# Patient Record
Sex: Female | Born: 1975 | Race: White | Hispanic: No | Smoking: Never smoker
Health system: Southern US, Community
[De-identification: ages and names within clinical notes are randomized; demographics above are authoritative.]

## PROBLEM LIST (undated history)

## (undated) DIAGNOSIS — R112 Nausea with vomiting, unspecified: Secondary | ICD-10-CM

## (undated) DIAGNOSIS — D509 Iron deficiency anemia, unspecified: Secondary | ICD-10-CM

## (undated) DIAGNOSIS — A6000 Herpesviral infection of urogenital system, unspecified: Secondary | ICD-10-CM

## (undated) DIAGNOSIS — L821 Other seborrheic keratosis: Secondary | ICD-10-CM

## (undated) DIAGNOSIS — F419 Anxiety disorder, unspecified: Secondary | ICD-10-CM

## (undated) DIAGNOSIS — Z9889 Other specified postprocedural states: Secondary | ICD-10-CM

## (undated) DIAGNOSIS — Z9884 Bariatric surgery status: Secondary | ICD-10-CM

## (undated) DIAGNOSIS — E538 Deficiency of other specified B group vitamins: Secondary | ICD-10-CM

## (undated) DIAGNOSIS — M7070 Other bursitis of hip, unspecified hip: Secondary | ICD-10-CM

## (undated) DIAGNOSIS — R5383 Other fatigue: Secondary | ICD-10-CM

## (undated) HISTORY — DX: Bariatric surgery status: Z98.84

## (undated) HISTORY — DX: Other bursitis of hip, unspecified hip: M70.70

## (undated) HISTORY — DX: Herpesviral infection of urogenital system, unspecified: A60.00

## (undated) HISTORY — DX: Other seborrheic keratosis: L82.1

## (undated) HISTORY — DX: Deficiency of other specified B group vitamins: E53.8

## (undated) HISTORY — PX: WISDOM TOOTH EXTRACTION: SHX21

## (undated) HISTORY — DX: Anxiety disorder, unspecified: F41.9

## (undated) HISTORY — DX: Iron deficiency anemia, unspecified: D50.9

## (undated) HISTORY — DX: Other fatigue: R53.83

---

## 1995-10-07 HISTORY — PX: CHOLECYSTECTOMY: SHX55

## 2001-10-06 HISTORY — PX: GASTRIC BYPASS: SHX52

## 2001-10-07 ENCOUNTER — Other Ambulatory Visit: Admission: RE | Admit: 2001-10-07 | Discharge: 2001-10-07 | Payer: Self-pay | Admitting: *Deleted

## 2002-10-06 HISTORY — PX: ABDOMINOPLASTY: SUR9

## 2002-10-06 HISTORY — PX: BREAST ENHANCEMENT SURGERY: SHX7

## 2004-03-26 ENCOUNTER — Other Ambulatory Visit: Admission: RE | Admit: 2004-03-26 | Discharge: 2004-03-26 | Payer: Self-pay | Admitting: Internal Medicine

## 2004-10-06 HISTORY — PX: TUBAL LIGATION: SHX77

## 2005-03-03 ENCOUNTER — Emergency Department (HOSPITAL_COMMUNITY): Admission: EM | Admit: 2005-03-03 | Discharge: 2005-03-03 | Payer: Self-pay | Admitting: Emergency Medicine

## 2005-08-16 ENCOUNTER — Inpatient Hospital Stay (HOSPITAL_COMMUNITY): Admission: AD | Admit: 2005-08-16 | Discharge: 2005-08-17 | Payer: Self-pay | Admitting: Obstetrics

## 2005-08-26 ENCOUNTER — Encounter (INDEPENDENT_AMBULATORY_CARE_PROVIDER_SITE_OTHER): Payer: Self-pay | Admitting: Specialist

## 2005-08-26 ENCOUNTER — Inpatient Hospital Stay (HOSPITAL_COMMUNITY): Admission: AD | Admit: 2005-08-26 | Discharge: 2005-08-27 | Payer: Self-pay | Admitting: Obstetrics

## 2005-10-29 ENCOUNTER — Ambulatory Visit: Payer: Self-pay | Admitting: Hematology and Oncology

## 2006-02-10 ENCOUNTER — Other Ambulatory Visit: Admission: RE | Admit: 2006-02-10 | Discharge: 2006-02-10 | Payer: Self-pay | Admitting: Internal Medicine

## 2006-02-18 ENCOUNTER — Ambulatory Visit: Payer: Self-pay | Admitting: Hematology and Oncology

## 2006-02-20 LAB — CBC WITH DIFFERENTIAL/PLATELET
EOS%: 1.3 % (ref 0.0–7.0)
Eosinophils Absolute: 0.1 10*3/uL (ref 0.0–0.5)
HCT: 37.1 % (ref 34.8–46.6)
LYMPH%: 39.3 % (ref 14.0–48.0)
MCHC: 35.1 g/dL (ref 32.0–36.0)
MCV: 88.7 fL (ref 81.0–101.0)
MONO#: 0.2 10*3/uL (ref 0.1–0.9)
MONO%: 5.6 % (ref 0.0–13.0)
NEUT%: 52.1 % (ref 39.6–76.8)
lymph#: 1.7 10*3/uL (ref 0.9–3.3)

## 2006-02-20 LAB — VITAMIN B12: Vitamin B-12: 317 pg/mL (ref 211–911)

## 2006-04-21 ENCOUNTER — Ambulatory Visit: Payer: Self-pay | Admitting: Hematology and Oncology

## 2006-05-15 LAB — CBC WITH DIFFERENTIAL/PLATELET
Basophils Absolute: 0 10*3/uL (ref 0.0–0.1)
HCT: 39.1 % (ref 34.8–46.6)
HGB: 13.5 g/dL (ref 11.6–15.9)
MCH: 31.8 pg (ref 26.0–34.0)
MCHC: 34.5 g/dL (ref 32.0–36.0)
MCV: 92.2 fL (ref 81.0–101.0)
RBC: 4.24 10*6/uL (ref 3.70–5.32)
WBC: 5.5 10*3/uL (ref 3.9–10.0)

## 2006-05-15 LAB — IRON AND TIBC: Iron: 109 ug/dL (ref 42–145)

## 2006-06-05 ENCOUNTER — Ambulatory Visit: Payer: Self-pay | Admitting: Hematology and Oncology

## 2006-07-15 ENCOUNTER — Ambulatory Visit: Payer: Self-pay | Admitting: Hematology and Oncology

## 2006-09-08 ENCOUNTER — Ambulatory Visit: Payer: Self-pay | Admitting: Hematology and Oncology

## 2006-11-03 ENCOUNTER — Ambulatory Visit: Payer: Self-pay | Admitting: Hematology and Oncology

## 2006-11-06 LAB — BASIC METABOLIC PANEL
BUN: 7 mg/dL (ref 6–23)
CO2: 25 mEq/L (ref 19–32)
Chloride: 106 mEq/L (ref 96–112)
Creatinine, Ser: 0.64 mg/dL (ref 0.40–1.20)
Potassium: 3.6 mEq/L (ref 3.5–5.3)
Sodium: 142 mEq/L (ref 135–145)

## 2006-11-06 LAB — CBC WITH DIFFERENTIAL/PLATELET
BASO%: 0.7 % (ref 0.0–2.0)
Basophils Absolute: 0 10*3/uL (ref 0.0–0.1)
HCT: 40.2 % (ref 34.8–46.6)
HGB: 14.2 g/dL (ref 11.6–15.9)
MCV: 91.2 fL (ref 81.0–101.0)
NEUT#: 3.6 10*3/uL (ref 1.5–6.5)
Platelets: 316 10*3/uL (ref 145–400)
RDW: 11.8 % (ref 11.3–14.5)
WBC: 5.7 10*3/uL (ref 3.9–10.0)
lymph#: 1.6 10*3/uL (ref 0.9–3.3)

## 2006-11-06 LAB — IRON AND TIBC: UIBC: 250 ug/dL

## 2006-11-06 LAB — FERRITIN: Ferritin: 160 ng/mL (ref 10–291)

## 2006-11-06 LAB — VITAMIN B12: Vitamin B-12: 489 pg/mL (ref 211–911)

## 2006-12-31 ENCOUNTER — Ambulatory Visit: Payer: Self-pay | Admitting: Hematology and Oncology

## 2007-03-09 ENCOUNTER — Ambulatory Visit: Payer: Self-pay | Admitting: Hematology and Oncology

## 2007-05-04 ENCOUNTER — Ambulatory Visit: Payer: Self-pay | Admitting: Hematology and Oncology

## 2007-05-21 LAB — CBC WITH DIFFERENTIAL/PLATELET
BASO%: 0.7 % (ref 0.0–2.0)
EOS%: 0.6 % (ref 0.0–7.0)
HCT: 39.5 % (ref 34.8–46.6)
HGB: 14.1 g/dL (ref 11.6–15.9)
MCH: 32.3 pg (ref 26.0–34.0)
MONO#: 0.4 10*3/uL (ref 0.1–0.9)
NEUT%: 56.5 % (ref 39.6–76.8)
Platelets: 317 10*3/uL (ref 145–400)
RDW: 11.9 % (ref 11.3–14.5)
WBC: 6.2 10*3/uL (ref 3.9–10.0)

## 2007-05-22 LAB — FERRITIN: Ferritin: 67 ng/mL (ref 10–291)

## 2007-05-22 LAB — VITAMIN B12: Vitamin B-12: 360 pg/mL (ref 211–911)

## 2007-05-22 LAB — IRON AND TIBC: Iron: 71 ug/dL (ref 42–145)

## 2007-06-24 ENCOUNTER — Ambulatory Visit: Payer: Self-pay | Admitting: Hematology and Oncology

## 2007-08-19 ENCOUNTER — Ambulatory Visit: Payer: Self-pay | Admitting: Hematology and Oncology

## 2007-10-15 ENCOUNTER — Ambulatory Visit: Payer: Self-pay | Admitting: Hematology and Oncology

## 2007-12-08 ENCOUNTER — Ambulatory Visit: Payer: Self-pay | Admitting: Hematology and Oncology

## 2011-02-21 NOTE — Discharge Summary (Signed)
NAME:  Abigail [name], Abigail [name]              ACCOUNT NO.:  [id number]   MEDICAL RECORD NO.:  [id number]          PATIENT TYPE:  INP   LOCATION:  9130                          FACILITY:  [facility]   PHYSICIAN:  Kathreen Cosier, M.D.DATE OF BIRTH:  1976-08-11   DATE OF ADMISSION:  08/26/2005  DATE OF DISCHARGE:                                 DISCHARGE SUMMARY   Patient is a 35 year old gravida 3, para 1-0-1-1, [facility] September 08, 2005.  Was  admitted in labor.  Made rapid progress and had a normal vaginal delivery.  The patient desired sterilization and on the day of delivery underwent  postpartum tubal ligation.  She desired discharge on day one.  On admission  her hemoglobin was 9.4, postoperative 7.1.  She was asymptomatic and started  on ferrous sulfate 325 b.i.d.  The patient was discharged home on the first  postpartum day, ambulatory, on a regular diet with no complaints.  She was  on Tylenol No.3 one to two every three to four hours p.r.n. and ferrous  sulfate 325 p.o. b.i.d.   DISCHARGE DIAGNOSES:  Status post normal vaginal delivery at term and  postpartum tubal ligation.           ______________________________  Kathreen Cosier, M.D.     BAM/MEDQ  D:  08/27/2005  T:  08/27/2005  Job:  [id number]

## 2011-02-21 NOTE — Op Note (Signed)
NAME:  Abigail [name], Abigail [name]              ACCOUNT NO.:  [id number]   MEDICAL RECORD NO.:  [id number]          PATIENT TYPE:  INP   LOCATION:  9130                          FACILITY:  [facility]   PHYSICIAN:  Kathreen Cosier, M.D.DATE OF BIRTH:  Jan 24, 1976   DATE OF PROCEDURE:  08/26/2005  DATE OF DISCHARGE:                                 OPERATIVE REPORT   PREOPERATIVE DIAGNOSES:  1.  Multiparity.  2.  Previous bypass surgery.  3.  Previous abdominoplasty.  4.  Previous gallbladder surgery.   DESCRIPTION OF PROCEDURE:   PREOPERATIVE DIAGNOSIS:  Multiparity.   OPERATION/PROCEDURE:  Postpartum tubal ligation.   ANESTHESIA:  Epidural.   DESCRIPTION OF PROCEDURE:  Under general anesthesia and the patient in the  supine position, abdomen was prepped and draped. Bladder emptied with  straight catheter.  Midline subumbilical incision one inch long was made  immediately below the umbilicus, carried down to the fascia.  Fascia was  thickened, scarred then had retention sutures.  Eventually the fascia was  cut and the peritoneum and tip of the finger, the left tube was grasped in  the mid portion with the Babcock clamp and traced to the fimbria.  A 0 plain  suture was placed on the mesosalpinx below the portion of the tube within  the clamp.  This was tied.  Approximately one inch of tube was transected.  On the right side there were adhesions around the tube and eventually the  tube was snared at the __________ fimbria and traced back to insertion to  the uterus and 0 plain suture placed in the mesosalpinx below the portion of  the tube within the clamp.  This was tied, cut.  Abdomen closed.  The fascia  was closed with continuous suture with 0 Dexon.  Skin closed with  subcuticular stitch of 4-0 Monocryl.  The patient tolerated the procedure  well and taken to the recovery room in good condition.           ______________________________  Kathreen Cosier, M.D.     BAM/MEDQ  D:   08/26/2005  T:  08/26/2005  Job:  [id number]

## 2012-09-16 ENCOUNTER — Telehealth: Payer: Self-pay | Admitting: Oncology

## 2012-09-16 NOTE — Telephone Encounter (Signed)
C/D 09/16/12 for appt.09/23/12

## 2012-09-22 ENCOUNTER — Encounter: Payer: Self-pay | Admitting: Oncology

## 2012-09-22 DIAGNOSIS — D509 Iron deficiency anemia, unspecified: Secondary | ICD-10-CM | POA: Insufficient documentation

## 2012-09-22 NOTE — Patient Instructions (Addendum)
1. Issue:  Anemia. 2.  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  Also need to rule out VitB12 defiency. 3.  Recommendation:    * start oral iron:  Either NuIron 150mg  by mouth twice daily or SlowFe 325mg  by mouth twice daily.  * IV iron (Ferahem) infusion to improve iron store and anemia much quicker.

## 2012-09-23 ENCOUNTER — Encounter: Payer: Self-pay | Admitting: Oncology

## 2012-09-23 ENCOUNTER — Telehealth: Payer: Self-pay | Admitting: Oncology

## 2012-09-23 ENCOUNTER — Ambulatory Visit (HOSPITAL_BASED_OUTPATIENT_CLINIC_OR_DEPARTMENT_OTHER): Payer: BC Managed Care – PPO | Admitting: Oncology

## 2012-09-23 ENCOUNTER — Other Ambulatory Visit (HOSPITAL_BASED_OUTPATIENT_CLINIC_OR_DEPARTMENT_OTHER): Payer: BC Managed Care – PPO | Admitting: Lab

## 2012-09-23 ENCOUNTER — Ambulatory Visit (HOSPITAL_BASED_OUTPATIENT_CLINIC_OR_DEPARTMENT_OTHER): Payer: BC Managed Care – PPO

## 2012-09-23 VITALS — BP 114/77 | HR 72 | Temp 98.1°F | Resp 18 | Ht 65.0 in | Wt 172.4 lb

## 2012-09-23 DIAGNOSIS — D509 Iron deficiency anemia, unspecified: Secondary | ICD-10-CM

## 2012-09-23 DIAGNOSIS — Z9884 Bariatric surgery status: Secondary | ICD-10-CM

## 2012-09-23 LAB — COMPREHENSIVE METABOLIC PANEL (CC13)
ALT: 21 U/L (ref 0–55)
Albumin: 3.4 g/dL — ABNORMAL LOW (ref 3.5–5.0)
Alkaline Phosphatase: 101 U/L (ref 40–150)
CO2: 28 mEq/L (ref 22–29)
Calcium: 9 mg/dL (ref 8.4–10.4)
Chloride: 106 mEq/L (ref 98–107)
Glucose: 95 mg/dl (ref 70–99)
Potassium: 4.5 mEq/L (ref 3.5–5.1)
Total Bilirubin: 0.37 mg/dL (ref 0.20–1.20)

## 2012-09-23 LAB — CBC WITH DIFFERENTIAL/PLATELET
BASO%: 1.6 % (ref 0.0–2.0)
Basophils Absolute: 0.1 10*3/uL (ref 0.0–0.1)
MCHC: 31.3 g/dL — ABNORMAL LOW (ref 31.5–36.0)
MCV: 66.6 fL — ABNORMAL LOW (ref 79.5–101.0)
NEUT#: 2.3 10*3/uL (ref 1.5–6.5)
NEUT%: 43.2 % (ref 38.4–76.8)
lymph#: 2.5 10*3/uL (ref 0.9–3.3)

## 2012-09-23 LAB — IRON AND TIBC: %SAT: 4 % — ABNORMAL LOW (ref 20–55)

## 2012-09-23 LAB — FERRITIN: Ferritin: 2 ng/mL — ABNORMAL LOW (ref 10–291)

## 2012-09-23 NOTE — Progress Notes (Signed)
CHECKED IN NEW PATIENT. NO FINANCIAL ISSUES.

## 2012-09-23 NOTE — Telephone Encounter (Signed)
Gave pt appt for tomorrow IV Iron ,then labs every month, see ML on June 2014 with labs

## 2012-09-23 NOTE — Progress Notes (Signed)
[facility]  Telephone:([phone]) [phone] [phone]) [phone]     INITIAL HEMATOLOGY CONSULTATION    Referral MD:  Windy Carina, [name].   Reason for Referral:  Anemia.     HPI:  [name] is a 36 year-old woman with history of obesity, s/p gastric bypass in 2003.  She had history of iron deficiency anemia and VitB12 deficiency anemia requiring IV iron and Vit B12 injection about 6 years ago when she was pregnant with her 2nd child.  Since then she was told that she was doing OK.  She has not been taking oral iron until a few weeks ago when she was found out to have microcytic anemia again.  She was kindly referred to [facility] for evaluation.  [name] presented to the clinic by herself today.  She reported ice picca a few months ago.  She has been feeling tired the last few weeks.  However, she also had a flu.  She reported generalized fatigue, SOB, and mild DOE.  She denied all visible source of bleeding.    Patient denies fever, anorexia, weight loss, headache, visual changes, confusion, drenching night sweats, palpable lymph node swelling, mucositis, odynophagia, dysphagia, nausea vomiting, jaundice, chest pain, palpitation, productive cough, gum bleeding, epistaxis, hematemesis, hemoptysis, abdominal pain, abdominal swelling, early satiety, melena, hematochezia, hematuria, skin rash, spontaneous bleeding, joint swelling, joint pain, heat or cold intolerance, bowel bladder incontinence, back pain, focal motor weakness, paresthesia, depression.      Past Medical History  Diagnosis Date  . Iron deficiency anemia     last IV iron in 2007; with oral supplement; since  last week; Rx iron  . Seborrheic keratosis   . Unspecified deficiency anemia   :    Past Surgical History  Procedure Date  . Gastric bypass 2003  . Cholecystectomy   . Tubal ligation   :   CURRENT MEDS: Current Outpatient Prescriptions  Medication Sig Dispense Refill  .  ALPRAZolam (XANAX) 0.25 MG tablet Take 0.25 mg by mouth at bedtime as needed.      . IRON PO Take by mouth 2 (two) times daily.          No Known Allergies:  Family History  Problem Relation Age of Onset  . Diabetes Father   . Heart failure Father   . Cancer Maternal Grandfather     colon  :  History   Social History  . Marital Status: Single    Spouse Name: N/A    Number of Children: 2  . Years of Education: N/A   Occupational History  .  [employer]   Social History Main Topics  . Smoking status: Not on file  . Smokeless tobacco: Not on file  . Alcohol Use: No  . Drug Use: No  . Sexually Active:    Other Topics Concern  . Not on file   Social History Narrative  . No narrative on file  :  REVIEW OF SYSTEM:  The rest of the 14-point review of sytem was negative.   Exam: ECOG 0-1  General:  well-nourished woman, in no acute distress.  Eyes:  no scleral icterus.  ENT:  There were no oropharyngeal lesions.  Neck was without thyromegaly.  Lymphatics:  Negative cervical, supraclavicular or axillary adenopathy.  Respiratory: lungs were clear bilaterally without wheezing or crackles.  Cardiovascular:  Regular rate and rhythm, S1/S2, without murmur, rub or gallop.  There  was no pedal edema.  GI:  abdomen was soft, flat, nontender, nondistended, without organomegaly. Rectal exam in the presence of nursing staff [name] showed normal rectal tone without palpable rectal or anal mass.  There was brown stool, Guaiac hem negative.  Muscoloskeletal:  no spinal tenderness of palpation of vertebral spine.  Skin exam was without echymosis, petichae.  Neuro exam was nonfocal.  Patient was able to get on and off exam table without assistance.  Gait was normal.  Patient was alerted and oriented.  Attention was good.   Language was appropriate.  Mood was normal without depression.  Speech was not pressured.  Thought content was not tangential.     LABS:  Lab Results  Component Value Date   WBC 5.3 09/23/2012   HGB 8.8* 09/23/2012   HCT 28.2* 09/23/2012   PLT 543* 09/23/2012   GLUCOSE 95 09/23/2012   ALT 21 09/23/2012   AST 26 09/23/2012   NA 140 09/23/2012   K 4.5 09/23/2012   CL 106 09/23/2012   CREATININE 0.7 09/23/2012   BUN 9.0 09/23/2012   CO2 28 09/23/2012    Blood smear review:   I personally reviewed the patient's peripheral blood smear today.  There was anisocytosis.  There was no peripheral blast.  There was no schistocytosis, spherocytosis, rouleaux formation, tear drop cell.  There was no giant platelets or platelet clumps.  There were rare target cells; occasional pencil-shaped cells;    ASSESSMENT AND PLAN:   1.  History of obesity, s/p gastric bypass in 2003. 2.  Microcytic anemia:  Most likely due to malabsorption due to gastric bypass surgery.  Stool was heme negative.  - Work up:  Iron panel and Vit B12 pending. - Treatment:   * start oral iron:  Either NuIron 150mg  by mouth twice daily or SlowFe 325mg  by mouth twice daily.  * IV iron (Ferahem) infusion to improve iron store and anemia much quicker.  There is a slight risk of infusion reaction.  She expressed informed understanding and wished to proceed.  * Anemia is not severe enough nor was there active bleeding to warrant pRBC transfusion.  3.  Follow up:  CBC at [facility] every 2 weeks until Hgb improves.  Return visit in about 6 months.       The length of time of the face-to-face encounter was 30 minutes. More than 50% of time was spent counseling and coordination of care.     Thank you for this referral.      '

## 2012-09-24 ENCOUNTER — Ambulatory Visit (HOSPITAL_BASED_OUTPATIENT_CLINIC_OR_DEPARTMENT_OTHER): Payer: BC Managed Care – PPO

## 2012-09-24 VITALS — BP 114/77 | HR 86 | Temp 98.2°F | Resp 18

## 2012-09-24 DIAGNOSIS — D509 Iron deficiency anemia, unspecified: Secondary | ICD-10-CM

## 2012-09-24 MED ORDER — SODIUM CHLORIDE 0.9 % IV SOLN
1020.0000 mg | Freq: Once | INTRAVENOUS | Status: AC
  Administered 2012-09-24: 1020 mg via INTRAVENOUS
  Filled 2012-09-24: qty 34

## 2012-09-24 NOTE — Patient Instructions (Addendum)
Ferumoxytol injection What is this medicine? FERUMOXYTOL is an iron complex. Iron is used to make healthy red blood cells, which carry oxygen and nutrients throughout the body. This medicine is used to treat iron deficiency anemia in people with chronic kidney disease. This medicine may be used for other purposes; ask your health care provider or pharmacist if you have questions. What should I tell my health care provider before I take this medicine? They need to know if you have any of these conditions: -anemia not caused by low iron levels -high levels of iron in the blood -magnetic resonance imaging (MRI) test scheduled -an unusual or allergic reaction to iron, other medicines, foods, dyes, or preservatives -pregnant or trying to get pregnant -breast-feeding How should I use this medicine? This medicine is for infusion into a vein. It is given by a health care professional in a hospital or clinic setting. Talk to your pediatrician regarding the use of this medicine in children. Special care may be needed. Overdosage: If you think you've taken too much of this medicine contact a poison control center or emergency room at once. Overdosage: If you think you have taken too much of this medicine contact a poison control center or emergency room at once. NOTE: This medicine is only for you. Do not share this medicine with others. What if I miss a dose? It is important not to miss your dose. Call your doctor or health care professional if you are unable to keep an appointment. What may interact with this medicine? This medicine may interact with the following medications: -other iron products This list may not describe all possible interactions. Give your health care provider a list of all the medicines, herbs, non-prescription drugs, or dietary supplements you use. Also tell them if you smoke, drink alcohol, or use illegal drugs. Some items may interact with your medicine. What should I watch  for while using this medicine? Visit your doctor or healthcare professional regularly. Tell your doctor or healthcare professional if your symptoms do not start to get better or if they get worse. You may need blood work done while you are taking this medicine. You may need to follow a special diet. Talk to your doctor. Foods that contain iron include: whole grains/cereals, dried fruits, beans, or peas, leafy green vegetables, and organ meats (liver, kidney). What side effects may I notice from receiving this medicine? Side effects that you should report to your doctor or health care professional as soon as possible: -allergic reactions like skin rash, itching or hives, swelling of the face, lips, or tongue -breathing problems -changes in blood pressure -feeling faint or lightheaded, falls -fever or chills -flushing, sweating, or hot feelings -swelling of the ankles or feet Side effects that usually do not require medical attention (Report these to your doctor or health care professional if they continue or are bothersome.): -diarrhea -headache -nausea, vomiting -stomach pain This list may not describe all possible side effects. Call your doctor for medical advice about side effects. You may report side effects to FDA at 1-800-FDA-1088. Where should I keep my medicine? This drug is given in a hospital or clinic and will not be stored at home. NOTE: This sheet is a summary. It may not cover all possible information. If you have questions about this medicine, talk to your doctor, pharmacist, or health care provider.  2012, Elsevier/Gold Standard. (06/14/2008 9:48:25 PM) 

## 2012-10-25 ENCOUNTER — Other Ambulatory Visit (HOSPITAL_BASED_OUTPATIENT_CLINIC_OR_DEPARTMENT_OTHER): Payer: BC Managed Care – PPO | Admitting: Lab

## 2012-10-25 DIAGNOSIS — D509 Iron deficiency anemia, unspecified: Secondary | ICD-10-CM

## 2012-10-25 LAB — CBC WITH DIFFERENTIAL/PLATELET
Basophils Absolute: 0.1 10*3/uL (ref 0.0–0.1)
Eosinophils Absolute: 0.1 10*3/uL (ref 0.0–0.5)
MONO#: 0.5 10*3/uL (ref 0.1–0.9)
NEUT#: 2.5 10*3/uL (ref 1.5–6.5)
Platelets: 269 10*3/uL (ref 145–400)
RBC: 4.75 10*6/uL (ref 3.70–5.45)
WBC: 5.2 10*3/uL (ref 3.9–10.3)
lymph#: 1.9 10*3/uL (ref 0.9–3.3)

## 2012-11-22 ENCOUNTER — Other Ambulatory Visit: Payer: BC Managed Care – PPO

## 2012-12-20 ENCOUNTER — Other Ambulatory Visit: Payer: BC Managed Care – PPO

## 2012-12-22 DIAGNOSIS — A6000 Herpesviral infection of urogenital system, unspecified: Secondary | ICD-10-CM | POA: Insufficient documentation

## 2013-01-24 ENCOUNTER — Encounter: Payer: Self-pay | Admitting: Oncology

## 2013-01-24 ENCOUNTER — Other Ambulatory Visit (HOSPITAL_BASED_OUTPATIENT_CLINIC_OR_DEPARTMENT_OTHER): Payer: BC Managed Care – PPO | Admitting: Lab

## 2013-01-24 DIAGNOSIS — D509 Iron deficiency anemia, unspecified: Secondary | ICD-10-CM

## 2013-01-24 LAB — FERRITIN: Ferritin: 9 ng/mL — ABNORMAL LOW (ref 10–291)

## 2013-01-24 LAB — CBC WITH DIFFERENTIAL/PLATELET
Basophils Absolute: 0.1 10*3/uL (ref 0.0–0.1)
Eosinophils Absolute: 0 10*3/uL (ref 0.0–0.5)
HGB: 13.6 g/dL (ref 11.6–15.9)
MCV: 90 fL (ref 79.5–101.0)
MONO#: 0.3 10*3/uL (ref 0.1–0.9)
NEUT#: 3 10*3/uL (ref 1.5–6.5)
RDW: 13.4 % (ref 11.2–14.5)
lymph#: 1.8 10*3/uL (ref 0.9–3.3)

## 2013-01-24 LAB — IRON AND TIBC
%SAT: 36 % (ref 20–55)
Iron: 146 ug/dL — ABNORMAL HIGH (ref 42–145)
TIBC: 407 ug/dL (ref 250–470)
UIBC: 261 ug/dL (ref 125–400)

## 2013-01-25 ENCOUNTER — Other Ambulatory Visit: Payer: Self-pay | Admitting: Oncology

## 2013-01-25 ENCOUNTER — Encounter: Payer: Self-pay | Admitting: Oncology

## 2013-02-17 ENCOUNTER — Telehealth: Payer: Self-pay | Admitting: Oncology

## 2013-02-17 NOTE — Telephone Encounter (Signed)
, °

## 2013-02-18 ENCOUNTER — Encounter: Payer: Self-pay | Admitting: Oncology

## 2013-02-18 ENCOUNTER — Other Ambulatory Visit (HOSPITAL_BASED_OUTPATIENT_CLINIC_OR_DEPARTMENT_OTHER): Payer: BC Managed Care – PPO | Admitting: Lab

## 2013-02-18 DIAGNOSIS — D509 Iron deficiency anemia, unspecified: Secondary | ICD-10-CM

## 2013-02-18 LAB — CBC WITH DIFFERENTIAL/PLATELET
BASO%: 1.1 % (ref 0.0–2.0)
EOS%: 1.7 % (ref 0.0–7.0)
MCH: 31.7 pg (ref 25.1–34.0)
MCHC: 33.9 g/dL (ref 31.5–36.0)
MONO%: 7.4 % (ref 0.0–14.0)
RDW: 14.1 % (ref 11.2–14.5)
lymph#: 2.1 10*3/uL (ref 0.9–3.3)

## 2013-02-21 ENCOUNTER — Other Ambulatory Visit: Payer: BC Managed Care – PPO

## 2013-03-24 ENCOUNTER — Other Ambulatory Visit: Payer: BC Managed Care – PPO | Admitting: Lab

## 2013-03-24 ENCOUNTER — Ambulatory Visit: Payer: BC Managed Care – PPO | Admitting: Oncology

## 2013-08-01 DIAGNOSIS — F411 Generalized anxiety disorder: Secondary | ICD-10-CM | POA: Insufficient documentation

## 2013-08-05 ENCOUNTER — Telehealth: Payer: Self-pay | Admitting: Hematology and Oncology

## 2013-08-05 NOTE — Telephone Encounter (Signed)
pt called and made FU appt to have labs and see NG since Dr Gaylyn Rong is no longer here shh

## 2013-08-09 ENCOUNTER — Telehealth: Payer: Self-pay | Admitting: Hematology and Oncology

## 2013-08-09 ENCOUNTER — Telehealth: Payer: Self-pay | Admitting: *Deleted

## 2013-08-09 NOTE — Telephone Encounter (Signed)
tx pt to Dr Maxine Glenn nurse Cameo bc she was asking for appt earlier than 08/15/13  shh

## 2013-08-09 NOTE — Telephone Encounter (Signed)
Pt left VM states her Ferritin done by PCP was 4.6.  Pt c/o feeling "really exhausted" and wants to know if she can be seen sooner than Monday 11/10 as scheduled?

## 2013-08-10 NOTE — Telephone Encounter (Signed)
I have a slot Friday 1145 am

## 2013-08-11 ENCOUNTER — Encounter: Payer: Self-pay | Admitting: Hematology and Oncology

## 2013-08-11 ENCOUNTER — Telehealth: Payer: Self-pay | Admitting: Hematology and Oncology

## 2013-08-11 ENCOUNTER — Other Ambulatory Visit: Payer: Self-pay | Admitting: Hematology and Oncology

## 2013-08-11 DIAGNOSIS — D509 Iron deficiency anemia, unspecified: Secondary | ICD-10-CM

## 2013-08-11 DIAGNOSIS — R5383 Other fatigue: Secondary | ICD-10-CM

## 2013-08-11 HISTORY — DX: Other fatigue: R53.83

## 2013-08-11 NOTE — Telephone Encounter (Signed)
Notified pt of lab and Dr. Bertis Ruddy tomorrow. She verbalized understanding.

## 2013-08-11 NOTE — Telephone Encounter (Signed)
Left VM informing pt of appt available tomorrow at 11:45 am.  Please call back to confirm, otherwise we will keep her appt on Monday as scheduled.  Pt left VM confirming she will come to see Dr. Bertis Ruddy at 11:45 am tomorrow.  She asks if she needs to come early for labs?

## 2013-08-11 NOTE — Telephone Encounter (Signed)
Added lb/fu for tomorrow 11/7 @ 11:15a. Confirmed d/t w/desk nurse. Per desk nurse pt aware.

## 2013-08-11 NOTE — Telephone Encounter (Signed)
I just put in orders

## 2013-08-12 ENCOUNTER — Ambulatory Visit (HOSPITAL_BASED_OUTPATIENT_CLINIC_OR_DEPARTMENT_OTHER): Payer: BC Managed Care – PPO | Admitting: Hematology and Oncology

## 2013-08-12 ENCOUNTER — Ambulatory Visit (HOSPITAL_BASED_OUTPATIENT_CLINIC_OR_DEPARTMENT_OTHER): Payer: BC Managed Care – PPO

## 2013-08-12 ENCOUNTER — Encounter: Payer: Self-pay | Admitting: Hematology and Oncology

## 2013-08-12 ENCOUNTER — Other Ambulatory Visit (HOSPITAL_BASED_OUTPATIENT_CLINIC_OR_DEPARTMENT_OTHER): Payer: BC Managed Care – PPO

## 2013-08-12 VITALS — BP 111/80 | HR 69 | Temp 97.0°F | Resp 18 | Ht 65.0 in | Wt 177.7 lb

## 2013-08-12 DIAGNOSIS — R5383 Other fatigue: Secondary | ICD-10-CM

## 2013-08-12 DIAGNOSIS — D509 Iron deficiency anemia, unspecified: Secondary | ICD-10-CM

## 2013-08-12 DIAGNOSIS — R5381 Other malaise: Secondary | ICD-10-CM

## 2013-08-12 DIAGNOSIS — E538 Deficiency of other specified B group vitamins: Secondary | ICD-10-CM

## 2013-08-12 DIAGNOSIS — Z9884 Bariatric surgery status: Secondary | ICD-10-CM

## 2013-08-12 HISTORY — DX: Bariatric surgery status: Z98.84

## 2013-08-12 HISTORY — DX: Deficiency of other specified B group vitamins: E53.8

## 2013-08-12 LAB — CBC & DIFF AND RETIC
Basophils Absolute: 0.1 10*3/uL (ref 0.0–0.1)
EOS%: 1.3 % (ref 0.0–7.0)
HGB: 12.8 g/dL (ref 11.6–15.9)
MCH: 29.8 pg (ref 25.1–34.0)
NEUT#: 2.6 10*3/uL (ref 1.5–6.5)
RBC: 4.29 10*6/uL (ref 3.70–5.45)
RDW: 12.3 % (ref 11.2–14.5)
Retic %: 1 % (ref 0.70–2.10)
Retic Ct Abs: 42.9 10*3/uL (ref 33.70–90.70)
lymph#: 2.3 10*3/uL (ref 0.9–3.3)
nRBC: 0 % (ref 0–0)

## 2013-08-12 LAB — T4, FREE: Free T4: 1.1 ng/dL (ref 0.80–1.80)

## 2013-08-12 LAB — FERRITIN CHCC: Ferritin: 6 ng/ml — ABNORMAL LOW (ref 9–269)

## 2013-08-12 LAB — IRON AND TIBC CHCC: TIBC: 419 ug/dL (ref 236–444)

## 2013-08-12 MED ORDER — SODIUM CHLORIDE 0.9 % IV SOLN
1020.0000 mg | Freq: Once | INTRAVENOUS | Status: AC
  Administered 2013-08-12: 1020 mg via INTRAVENOUS
  Filled 2013-08-12: qty 34

## 2013-08-12 MED ORDER — SODIUM CHLORIDE 0.9 % IV SOLN
Freq: Once | INTRAVENOUS | Status: AC
  Administered 2013-08-12: 13:00:00 via INTRAVENOUS

## 2013-08-12 NOTE — Progress Notes (Signed)
1320-Pt with no complaints at end of Feraheme.

## 2013-08-12 NOTE — Progress Notes (Signed)
[facility] OFFICE PROGRESS NOTE  [name], Abigail Bud, [name] DIAGNOSIS:  Severe iron deficiency anemia, on background history of gastric bypass and severe menorrhagia  SUMMARY OF HEMATOLOGIC HISTORY: This is a pleasant 37 year old lady with background history of morbid obesity status post gastric bypass in 2003. She was found to have history of B12 and iron deficiency anemia and had  received intravenous iron infusion in the past. INTERVAL HISTORY: Abigail [name] 37 y.o. female returns for further followup. She complained of new pica. She also complained of feeling fatigued. The patient endorsed history of severe menorrhagia with irregular periods, bleeding on average 5-9 days with a cycle of every 30 days. The patient denies any recent signs or symptoms of bleeding such as spontaneous epistaxis, hematuria or hematochezia.  I have reviewed the past medical history, past surgical history, social history and family history with the patient and they are unchanged from previous note.  ALLERGIES:  has No Known Allergies.  MEDICATIONS:  Current Outpatient Prescriptions  Medication Sig Dispense Refill  . ALPRAZolam (XANAX) 0.25 MG tablet Take 0.25 mg by mouth at bedtime as needed.      . IRON PO Take by mouth 2 (two) times daily.       No current facility-administered medications for this visit.   Facility-Administered Medications Ordered in Other Visits  Medication Dose Route Frequency Provider Last Rate Last Dose  . ferumoxytol (FERAHEME) 1,020 mg in sodium chloride 0.9 % 100 mL IVPB  1,020 mg Intravenous Once Artis Delay, MD   1,020 mg at 08/12/13 1256     REVIEW OF SYSTEMS:   Constitutional: Denies fevers, chills or night sweats Neurological:Denies numbness, tingling or new weaknesses Behavioral/Psych: Mood is stable, no new changes  All other systems were reviewed with the patient and are negative.  PHYSICAL EXAMINATION: ECOG PERFORMANCE STATUS: 1 - Symptomatic but completely  ambulatory  Filed Vitals:   08/12/13 1155  BP: 111/80  Pulse: 69  Temp: 97 F (36.1 C)  Resp: 18   Filed Weights   08/12/13 1155  Weight: 177 lb 11.2 oz (80.604 kg)    GENERAL:alert, no distress and comfortable. The patient is moderately obese SKIN: skin color, texture, turgor are normal, no rashes or significant lesions EYES: normal, Conjunctiva are pink and non-injected, sclera clear OROPHARYNX:no exudate, no erythema and lips, buccal mucosa, and tongue normal  NECK: supple, thyroid normal size, non-tender, without nodularity LYMPH:  no palpable lymphadenopathy in the cervical, axillary or inguinal LUNGS: clear to auscultation and percussion with normal breathing effort HEART: regular rate & rhythm and no murmurs and no lower extremity edema ABDOMEN:abdomen soft, non-tender and normal bowel sounds Musculoskeletal:no cyanosis of digits and no clubbing  NEURO: alert & oriented x 3 with fluent speech, no focal motor/sensory deficits  LABORATORY DATA:  I have reviewed the data as listed Results for orders placed in visit on 08/12/13 (from the past 48 hour(s))  CBC & DIFF AND RETIC     Status: None   Collection Time    08/12/13 11:31 AM      Result Value Range   WBC 5.5  3.9 - 10.3 10e3/uL   NEUT# 2.6  1.5 - 6.5 10e3/uL   HGB 12.8  11.6 - 15.9 g/dL   HCT 16.1  09.6 - 04.5 %   Platelets 321  145 - 400 10e3/uL   MCV 89.7  79.5 - 101.0 fL   MCH 29.8  25.1 - 34.0 pg   MCHC 33.2  31.5 -  36.0 g/dL   RBC 2.13  0.86 - 5.78 10e6/uL   RDW 12.3  11.2 - 14.5 %   lymph# 2.3  0.9 - 3.3 10e3/uL   MONO# 0.5  0.1 - 0.9 10e3/uL   Eosinophils Absolute 0.1  0.0 - 0.5 10e3/uL   Basophils Absolute 0.1  0.0 - 0.1 10e3/uL   NEUT% 47.6  38.4 - 76.8 %   LYMPH% 41.2  14.0 - 49.7 %   MONO% 8.8  0.0 - 14.0 %   EOS% 1.3  0.0 - 7.0 %   BASO% 1.1  0.0 - 2.0 %   nRBC 0  0 - 0 %   Retic % 1.00  0.70 - 2.10 %   Retic Ct Abs 42.90  33.70 - 90.70 10e3/uL   Immature Retic Fract 6.10  1.60 - 10.00 %   IRON AND TIBC [facility]     Status: Abnormal   Collection Time    08/12/13 11:31 AM      Result Value Range   Iron 67  41 - 142 ug/dL   TIBC 469  629 - 528 ug/dL   UIBC 413  244 - 010 ug/dL   %SAT 16 (*) 21 - 57 %  FERRITIN [facility]     Status: Abnormal   Collection Time    08/12/13 11:31 AM      Result Value Range   Ferritin 6 (*) 9 - 269 ng/ml  TSH [facility]     Status: None   Collection Time    08/12/13 11:31 AM      Result Value Range   TSH 1.017  0.308 - 3.960 m(IU)/L    ASSESSMENT:  Recurrent iron deficiency anemia  PLAN:  #1 recurrent iron deficiency anemia This is due to the combination of bowel obstruction from gastric bypass and menorrhagia. She never had EGD or colonoscopy done before. Her routine Pap smears have been negative. I recommend repeat intravenous iron infusion and we check another ferritin. If her ferritin rapidly drops, we might have to do GI evaluation. We discussed some of the risks, benefits, and alternatives of intravenous iron infusions. The patient is symptomatic from low iron and the iron level is critically low. She tolerated oral iron supplement poorly and desires to achieved higher levels of iron faster for adequate hematopoesis. Some of the side-effects to be expected including risks of infusion reactions, phlebitis, headaches, nausea and fatigue.  The patient is willing to proceed  #2 history of B12 deficiency I recommend high-dose oral supplement and we'll recheck a B12 level in the next visit. If the B12 is low, I recommend B12 injection #3 history of gastric bypass The patient is at risk of bowel obstruction a father minerals. I recommend high-dose vitamin D supplementation.  All questions were answered. The patient knows to call the clinic with any problems, questions or concerns. No barriers to learning was detected.  I spent 25 minutes counseling the patient face to face. The total time spent in the appointment was 40 minutes and more than 50% was on  counseling.     Tyronza Happe, MD 08/12/2013 1:07 PM

## 2013-08-12 NOTE — Patient Instructions (Signed)

## 2013-08-15 ENCOUNTER — Other Ambulatory Visit: Payer: BC Managed Care – PPO

## 2013-08-15 ENCOUNTER — Telehealth: Payer: Self-pay | Admitting: Hematology and Oncology

## 2013-08-15 ENCOUNTER — Ambulatory Visit: Payer: BC Managed Care – PPO | Admitting: Hematology and Oncology

## 2013-08-15 NOTE — Telephone Encounter (Signed)
s.w. pt and advised on 11.8.14 appt...sed added tx.

## 2013-09-12 ENCOUNTER — Other Ambulatory Visit (HOSPITAL_BASED_OUTPATIENT_CLINIC_OR_DEPARTMENT_OTHER): Payer: BC Managed Care – PPO | Admitting: Lab

## 2013-09-12 ENCOUNTER — Telehealth: Payer: Self-pay | Admitting: Hematology and Oncology

## 2013-09-12 ENCOUNTER — Ambulatory Visit: Payer: BC Managed Care – PPO

## 2013-09-12 ENCOUNTER — Encounter: Payer: Self-pay | Admitting: Hematology and Oncology

## 2013-09-12 ENCOUNTER — Ambulatory Visit (HOSPITAL_BASED_OUTPATIENT_CLINIC_OR_DEPARTMENT_OTHER): Payer: BC Managed Care – PPO | Admitting: Hematology and Oncology

## 2013-09-12 VITALS — BP 108/65 | HR 79 | Temp 98.0°F | Resp 18 | Ht 65.0 in | Wt 179.6 lb

## 2013-09-12 DIAGNOSIS — E538 Deficiency of other specified B group vitamins: Secondary | ICD-10-CM

## 2013-09-12 DIAGNOSIS — N920 Excessive and frequent menstruation with regular cycle: Secondary | ICD-10-CM

## 2013-09-12 DIAGNOSIS — D509 Iron deficiency anemia, unspecified: Secondary | ICD-10-CM

## 2013-09-12 DIAGNOSIS — Z9884 Bariatric surgery status: Secondary | ICD-10-CM

## 2013-09-12 LAB — CBC & DIFF AND RETIC
BASO%: 1.2 % (ref 0.0–2.0)
EOS%: 1.5 % (ref 0.0–7.0)
Eosinophils Absolute: 0.1 10*3/uL (ref 0.0–0.5)
LYMPH%: 27.7 % (ref 14.0–49.7)
MCHC: 33.7 g/dL (ref 31.5–36.0)
MCV: 91.6 fL (ref 79.5–101.0)
MONO#: 0.5 10*3/uL (ref 0.1–0.9)
MONO%: 7.6 % (ref 0.0–14.0)
NEUT#: 3.7 10*3/uL (ref 1.5–6.5)
NEUT%: 62 % (ref 38.4–76.8)
RBC: 4.54 10*6/uL (ref 3.70–5.45)
RDW: 13.7 % (ref 11.2–14.5)
Retic %: 1.88 % (ref 0.70–2.10)
WBC: 6 10*3/uL (ref 3.9–10.3)
nRBC: 0 % (ref 0–0)

## 2013-09-12 LAB — IRON AND TIBC CHCC
%SAT: 55 % (ref 21–57)
Iron: 155 ug/dL — ABNORMAL HIGH (ref 41–142)
TIBC: 283 ug/dL (ref 236–444)

## 2013-09-12 LAB — VITAMIN B12: Vitamin B-12: 310 pg/mL (ref 211–911)

## 2013-09-12 LAB — FERRITIN CHCC: Ferritin: 144 ng/mL (ref 9–269)

## 2013-09-12 NOTE — Progress Notes (Signed)
[name], [name], [name] DIAGNOSIS:  Severe iron deficiency anemia due to severe menorrhagia, on background history of gastric bypass  SUMMARY OF HEMATOLOGIC HISTORY: This is a pleasant 37 year old lady with background history of morbid obesity status post gastric bypass in 2003. She was found to have history of B12 and iron deficiency anemia and had received intravenous iron infusion in the past. In November 2014, she received one more dose of intravenous iron infusion INTERVAL HISTORY: Abigail [name] 36 y.o. female returns for further followup. She still had persistent menorrhagia. She has some mild fatigue. She has an appointment to see a gynecologist for further evaluation and treatment for menorrhagia. The patient denies any recent signs or symptoms of bleeding such as spontaneous epistaxis, hematuria or hematochezia.  I have reviewed the past medical history, past surgical history, social history and family history with the patient and they are unchanged from previous note.  ALLERGIES:  has No Known Allergies.  MEDICATIONS:  Current Outpatient Prescriptions  Medication Sig Dispense Refill  . ALPRAZolam (XANAX) 0.25 MG tablet Take 0.25 mg by mouth at bedtime as needed.      . IRON PO Take by mouth 2 (two) times daily.       No current facility-administered medications for this visit.     REVIEW OF SYSTEMS:   Constitutional: Denies fevers, chills or night sweats Eyes: Denies blurriness of vision Ears, nose, mouth, throat, and face: Denies mucositis or sore throat Respiratory: Denies cough, dyspnea or wheezes Cardiovascular: Denies palpitation, chest discomfort or lower extremity swelling Gastrointestinal:  Denies nausea, heartburn or change in bowel habits Skin: Denies abnormal skin rashes Lymphatics: Denies new lymphadenopathy or easy bruising Neurological:Denies numbness, tingling or new weaknesses Behavioral/Psych: Mood is  stable, no new changes  All other systems were reviewed with the patient and are negative.  PHYSICAL EXAMINATION: ECOG PERFORMANCE STATUS: 0 - Asymptomatic  Filed Vitals:   09/12/13 0930  BP: 108/65  Pulse: 79  Temp: 98 F (36.7 C)  Resp: 18   Filed Weights   09/12/13 0930  Weight: 179 lb 9.6 oz (81.466 kg)    GENERAL:alert, no distress and comfortable NEURO: alert & oriented x 3 with fluent speech, no focal motor/sensory deficits  LABORATORY DATA:  I have reviewed the data as listed Results for orders placed in visit on 09/12/13 (from the past 48 hour(s))  CBC & DIFF AND RETIC     Status: None   Collection Time    09/12/13  9:12 AM      Result Value Range   WBC 6.0  3.9 - 10.3 10e3/uL   NEUT# 3.7  1.5 - 6.5 10e3/uL   HGB 14.0  11.6 - 15.9 g/dL   HCT 82.9  56.2 - 13.0 %   Platelets 282  145 - 400 10e3/uL   MCV 91.6  79.5 - 101.0 fL   MCH 30.8  25.1 - 34.0 pg   MCHC 33.7  31.5 - 36.0 g/dL   RBC 8.65  7.84 - 6.96 10e6/uL   RDW 13.7  11.2 - 14.5 %   lymph# 1.7  0.9 - 3.3 10e3/uL   MONO# 0.5  0.1 - 0.9 10e3/uL   Eosinophils Absolute 0.1  0.0 - 0.5 10e3/uL   Basophils Absolute 0.1  0.0 - 0.1 10e3/uL   NEUT% 62.0  38.4 - 76.8 %   LYMPH% 27.7  14.0 - 49.7 %   MONO% 7.6  0.0 - 14.0 %  EOS% 1.5  0.0 - 7.0 %   BASO% 1.2  0.0 - 2.0 %   nRBC 0  0 - 0 %   Retic % 1.88  0.70 - 2.10 %   Retic Ct Abs 85.35  33.70 - 90.70 10e3/uL   Immature Retic Fract 6.10  1.60 - 10.00 %  FERRITIN [facility]     Status: None   Collection Time    09/12/13  9:12 AM      Result Value Range   Ferritin 144  9 - 269 ng/ml  IRON AND TIBC [facility]     Status: Abnormal   Collection Time    09/12/13  9:12 AM      Result Value Range   Iron 155 (*) 41 - 142 ug/dL   TIBC 161  096 - 045 ug/dL   UIBC 409  811 - 914 ug/dL   %SAT 55  21 - 57 %    Lab Results  Component Value Date   WBC 6.0 09/12/2013   HGB 14.0 09/12/2013   HCT 41.6 09/12/2013   MCV 91.6 09/12/2013   PLT 282 09/12/2013   ASSESSMENT &  PLAN:  #1 history of iron deficiency anemia Her repeat CBC showed improvement of her hemoglobin and her ferritin is now over 100. I recommend she keeps the appointment to see the gynecologist for treatment of menorrhagia. I plan to see her back in 4 months with repeat blood work #2 history of B12 deficiency I recommend she takes oral B12 supplements. B12 level from today is pending. #3 history of gastric bypass I recommend she take vitamin D supplement.  All questions were answered. The patient knows to call the clinic with any problems, questions or concerns. No barriers to learning was detected.  I spent 15 minutes counseling the patient face to face. The total time spent in the appointment was 20 minutes and more than 50% was on counseling.     Arlayne Liggins, MD 09/12/2013 10:38 AM

## 2013-09-12 NOTE — Telephone Encounter (Signed)
gave pt appt for lab and MD on April 2015 °

## 2013-09-29 ENCOUNTER — Ambulatory Visit (INDEPENDENT_AMBULATORY_CARE_PROVIDER_SITE_OTHER): Payer: BC Managed Care – PPO | Admitting: Family Medicine

## 2013-09-29 ENCOUNTER — Ambulatory Visit: Payer: BC Managed Care – PPO

## 2013-09-29 VITALS — BP 138/89 | HR 73 | Temp 98.2°F | Resp 18 | Ht 65.0 in | Wt 176.2 lb

## 2013-09-29 DIAGNOSIS — R109 Unspecified abdominal pain: Secondary | ICD-10-CM

## 2013-09-29 DIAGNOSIS — R112 Nausea with vomiting, unspecified: Secondary | ICD-10-CM

## 2013-09-29 LAB — POCT CBC
Granulocyte percent: 85.6 %G — AB (ref 37–80)
HCT, POC: 47.9 % (ref 37.7–47.9)
Hemoglobin: 15.4 g/dL (ref 12.2–16.2)
Lymph, poc: 1 (ref 0.6–3.4)
MCH, POC: 31.5 pg — AB (ref 27–31.2)
MCHC: 32.2 g/dL (ref 31.8–35.4)
MCV: 97.9 fL — AB (ref 80–97)
MID (cbc): 0.4 (ref 0–0.9)
MPV: 9 fL (ref 0–99.8)
POC Granulocyte: 8.4 — AB (ref 2–6.9)
POC LYMPH PERCENT: 10.4 %L (ref 10–50)
POC MID %: 4 % (ref 0–12)
Platelet Count, POC: 346 10*3/uL (ref 142–424)
RBC: 4.89 M/uL (ref 4.04–5.48)
RDW, POC: 15.6 %
WBC: 9.8 10*3/uL (ref 4.6–10.2)

## 2013-09-29 LAB — POCT URINALYSIS DIPSTICK
Blood, UA: NEGATIVE
Glucose, UA: NEGATIVE
Ketones, UA: >=160
Leukocytes, UA: NEGATIVE
Nitrite, UA: NEGATIVE
Protein, UA: 30
Spec Grav, UA: >=1.03
Urobilinogen, UA: 0.2
pH, UA: 5.5

## 2013-09-29 LAB — POCT UA - MICROSCOPIC ONLY
Casts, Ur, LPF, POC: NEGATIVE
Crystals, Ur, HPF, POC: NEGATIVE
Yeast, UA: NEGATIVE

## 2013-09-29 MED ORDER — ONDANSETRON 4 MG PO TBDP
4.0000 mg | ORAL_TABLET | Freq: Once | ORAL | Status: AC
  Administered 2013-09-29: 4 mg via ORAL

## 2013-09-29 MED ORDER — ONDANSETRON 4 MG PO TBDP: 4.0000 mg | ORAL_TABLET | Freq: Three times a day (TID) | ORAL | Status: DC | PRN

## 2013-09-29 NOTE — Patient Instructions (Signed)
Constipation, Adult Constipation is when a person has fewer than 3 bowel movements a week; has difficulty having a bowel movement; or has stools that are dry, hard, or larger than normal. As people grow older, constipation is more common. If you try to fix constipation with medicines that make you have a bowel movement (laxatives), the problem may get worse. Long-term laxative use may cause the muscles of the colon to become weak. A low-fiber diet, not taking in enough fluids, and taking certain medicines may make constipation worse. CAUSES   Certain medicines, such as antidepressants, pain medicine, iron supplements, antacids, and water pills.   Certain diseases, such as diabetes, irritable bowel syndrome (IBS), thyroid disease, or depression.   Not drinking enough water.   Not eating enough fiber-rich foods.   Stress or travel.  Lack of physical activity or exercise.  Not going to the restroom when there is the urge to have a bowel movement.  Ignoring the urge to have a bowel movement.  Using laxatives too much. SYMPTOMS   Having fewer than 3 bowel movements a week.   Straining to have a bowel movement.   Having hard, dry, or larger than normal stools.   Feeling full or bloated.   Pain in the lower abdomen.  Not feeling relief after having a bowel movement. DIAGNOSIS  Your caregiver will take a medical history and perform a physical exam. Further testing may be done for severe constipation. Some tests may include:   A barium enema X-ray to examine your rectum, colon, and sometimes, your small intestine.  A sigmoidoscopy to examine your lower colon.  A colonoscopy to examine your entire colon. TREATMENT  Treatment will depend on the severity of your constipation and what is causing it. Some dietary treatments include drinking more fluids and eating more fiber-rich foods. Lifestyle treatments may include regular exercise. If these diet and lifestyle recommendations  do not help, your caregiver may recommend taking over-the-counter laxative medicines to help you have bowel movements. Prescription medicines may be prescribed if over-the-counter medicines do not work.  HOME CARE INSTRUCTIONS   Increase dietary fiber in your diet, such as fruits, vegetables, whole grains, and beans. Limit high-fat and processed sugars in your diet, such as [place] fries, hamburgers, cookies, candies, and soda.   A fiber supplement may be added to your diet if you cannot get enough fiber from foods.   Drink enough fluids to keep your urine clear or pale yellow.   Exercise regularly or as directed by your caregiver.   Go to the restroom when you have the urge to go. Do not hold it.  Only take medicines as directed by your caregiver. Do not take other medicines for constipation without talking to your caregiver first. SEEK IMMEDIATE MEDICAL CARE IF:   You have bright red blood in your stool.   Your constipation lasts for more than 4 days or gets worse.   You have abdominal or rectal pain.   You have thin, pencil-like stools.  You have unexplained weight loss. MAKE SURE YOU:   Understand these instructions.  Will watch your condition.  Will get help right away if you are not doing well or get worse. Document Released: 06/20/2004 Document Revised: 12/15/2011 Document Reviewed: 08/26/2011 [facility] Patient Information 2014 [place], [place]. Abdominal Pain Many things can cause belly (abdominal) pain. Most times, the belly pain is not dangerous. The amount of belly pain does not tell how serious the problem may be. Many cases of belly pain  can be watched and treated at home. HOME CARE   Do not take medicines that help you go poop (laxatives) unless told to by your doctor.  Only take medicine as told by your doctor.  Eat or drink as told by your doctor. Your doctor will tell you if you should be on a special diet. GET HELP RIGHT AWAY IF:   The pain does not  go away.  You have a fever.  You keep throwing up (vomiting).  The pain changes and is only in the right or left part of the belly.  You have bloody or tarry looking poop. MAKE SURE YOU:   Understand these instructions.  Will watch your condition.  Will get help right away if you are not doing well or get worse. Document Released: 03/10/2008 Document Revised: 12/15/2011 Document Reviewed: 10/08/2009 [facility] Patient Information 2014 [place], [place].

## 2013-09-29 NOTE — Progress Notes (Signed)
Chief Complaint:  Chief Complaint  Patient presents with  . abdominal pain    vomitting and diarrhea x 3 days, had iron infusion a few weeks ago  being monitored for GI bleed or periods for cause.    HPI: Abigail [name] is a 37 y.o. female who is here for Pt presents with abdominal pain with diarrhea and emesis. This started 3 days ago. Last weekend she had constipation. She took OTC laxative which caused her to have nonbloody  diarrhea. At present time she is vomiting everything she takes in. + chills, no fevers. Last BM was yesterday. Has never had this befores. Has had tummy tuck, cholecystectomy, has had gastric bypass in 2003. No prior hsitory of SBO. She has been passing gas. She feels bloated and has back pain. She has not really eaten anything since yesterday. She has had nausea and vomitus, feels like things get stuck in her midchest. Had iron infusion 1 week ago for low iron for possible GIB. No new meds except for iron. Still has appendix.   Past Medical History  Diagnosis Date  . Iron deficiency anemia     last IV iron in 2007; with oral supplement; since  last week; Rx iron  . Seborrheic keratosis   . Unspecified deficiency anemia   . Fatigue 08/11/2013  . H/O gastric bypass 08/12/2013  . B12 deficiency 08/12/2013   Past Surgical History  Procedure Laterality Date  . Gastric bypass  2003  . Cholecystectomy    . Tubal ligation     History   Social History  . Marital Status: Single    Spouse Name: N/A    Number of Children: 2  . Years of Education: N/A   Occupational History  .  [employer]   Social History Main Topics  . Smoking status: Never Smoker   . Smokeless tobacco: Never Used  . Alcohol Use: Yes     Comment: twice monthly  . Drug Use: No  . Sexual Activity: None   Other Topics Concern  . None   Social History Narrative  . None   Family History  Problem Relation Age of Onset  . Diabetes Father   . Heart failure  Father   . Cancer Maternal Grandfather     colon   No Known Allergies Prior to Admission medications   Medication Sig Start Date End Date Taking? Authorizing Provider  ALPRAZolam (XANAX) 0.25 MG tablet Take 0.25 mg by mouth at bedtime as needed.   Yes Historical Provider, MD  IRON PO Take by mouth 2 (two) times daily.   Yes Historical Provider, MD     ROS: The patient denies night sweats, unintentional weight loss, chest pain, palpitations, wheezing, dyspnea on exertion, dysuria, hematuria, melena, numbness, weakness, or tingling.   All other systems have been reviewed and were otherwise negative with the exception of those mentioned in the HPI and as above.    PHYSICAL EXAM: Filed Vitals:   09/29/13 1352  BP: 138/89  Pulse: 73  Temp: 98.2 F (36.8 C)  Resp: 18   Filed Vitals:   09/29/13 1352  Height: 5\' 5"  (1.651 m)  Weight: 176 lb 3.2 oz (79.924 kg)   Body mass index is 29.32 kg/(m^2).  General: Alert, no acute distress HEENT:  Normocephalic, atraumatic, oropharynx patent. EOMI, PERRLA Cardiovascular:  Regular rate and rhythm, no rubs murmurs or gallops.  No Carotid bruits, radial pulse intact. No  pedal edema.  Respiratory: Clear to auscultation bilaterally.  No wheezes, rales, or rhonchi.  No cyanosis, no use of accessory musculature GI: No organomegaly, abdomen is soft and mild epigastric -tenderness, positive bowel sounds.  No masses. Skin: No rashes. Neurologic: Facial musculature symmetric. Psychiatric: Patient is appropriate throughout our interaction. Lymphatic: No cervical lymphadenopathy Musculoskeletal: Gait intact.   LABS: Results for orders placed in visit on 09/29/13  POCT CBC      Result Value Range   WBC 9.8  4.6 - 10.2 K/uL   Lymph, poc 1.0  0.6 - 3.4   POC LYMPH PERCENT 10.4  10 - 50 %L   MID (cbc) 0.4  0 - 0.9   POC MID % 4.0  0 - 12 %M   POC Granulocyte 8.4 (*) 2 - 6.9   Granulocyte percent 85.6 (*) 37 - 80 %G   RBC 4.89  4.04 - 5.48 M/uL    Hemoglobin 15.4  12.2 - 16.2 g/dL   HCT, POC 16.1  09.6 - 47.9 %   MCV 97.9 (*) 80 - 97 fL   MCH, POC 31.5 (*) 27 - 31.2 pg   MCHC 32.2  31.8 - 35.4 g/dL   RDW, POC 04.5     Platelet Count, POC 346  142 - 424 K/uL   MPV 9.0  0 - 99.8 fL  POCT UA - MICROSCOPIC ONLY      Result Value Range   WBC, Ur, HPF, POC 0-6     RBC, urine, microscopic 0-3     Bacteria, U Microscopic small     Mucus, UA large     Epithelial cells, urine per micros 0-14     Crystals, Ur, HPF, POC neg     Casts, Ur, LPF, POC neg     Yeast, UA neg    POCT URINALYSIS DIPSTICK      Result Value Range   Color, UA yellow     Clarity, UA clear     Glucose, UA neg     Bilirubin, UA moderate     Ketones, UA >=160 mg/dl     Spec Grav, UA >=4.098     Blood, UA neg     pH, UA 5.5     Protein, UA 30     Urobilinogen, UA 0.2     Nitrite, UA neg     Leukocytes, UA Negative       EKG/XRAY:   Primary read interpreted by Dr. Conley Rolls at [facility]. No obstruction, no free air Prior granuloma also noted on 09/15/2012 xray, unchanged in size, please comment + moderate stool burden   ASSESSMENT/PLAN: Encounter Diagnoses  Name Primary?  . Abdominal pain, unspecified site Yes  . Nausea with vomiting    Pleasant 37 year old female with h/o  gastric bypass, she has abd pain with N/V and on xray has a moderate stool burden Rx zofran, zofran ODT given in office, push fluids and try otc miralax She received 2 L of IVF in office and also zofran Precautions givento go to ER Low threshold for CT scan of abd Labs pending F/u prn   Gross sideeffects, risk and benefits, and alternatives of medications d/w patient. Patient is aware that all medications have potential sideeffects and we are unable to predict every sideeffect or drug-drug interaction that may occur.  Hamilton Capri PHUONG, DO 09/29/2013 5:45 PM

## 2013-09-29 NOTE — Progress Notes (Deleted)
Subjective:  Pt presents with abdominal pain with diarrhea and emesis. This started 3 days ago. Last weekend she had constipation. She took OTC laxative which caused her to have diarrhea. At present time she is vomiting everything she takes in. .  Patient ID: Abigail [name], female    DOB: 09/12/1976, 37 y.o.   MRN: [id number]  HPI       Chief Complaint:  Chief Complaint  Patient presents with  . abdominal pain    vomitting and diarrhea x 3 days, had iron infusion a few weeks ago  being monitored for GI bleed or periods for cause.    HPI: Abigail [name] is a 38 y.o. female who is here for  ***  Past Medical History  Diagnosis Date  . Iron deficiency anemia     last IV iron in 2007; with oral supplement; since  last week; Rx iron  . Seborrheic keratosis   . Unspecified deficiency anemia   . Fatigue 08/11/2013  . H/O gastric bypass 08/12/2013  . B12 deficiency 08/12/2013   Past Surgical History  Procedure Laterality Date  . Gastric bypass  2003  . Cholecystectomy    . Tubal ligation     History   Social History  . Marital Status: Single    Spouse Name: N/A    Number of Children: 2  . Years of Education: N/A   Occupational History  .  [employer]   Social History Main Topics  . Smoking status: Never Smoker   . Smokeless tobacco: Never Used  . Alcohol Use: Yes     Comment: twice monthly  . Drug Use: No  . Sexual Activity: None   Other Topics Concern  . None   Social History Narrative  . None   Family History  Problem Relation Age of Onset  . Diabetes Father   . Heart failure Father   . Cancer Maternal Grandfather     colon   No Known Allergies Prior to Admission medications   Medication Sig Start Date End Date Taking? Authorizing Provider  ALPRAZolam (XANAX) 0.25 MG tablet Take 0.25 mg by mouth at bedtime as needed.   Yes Historical Provider, MD  IRON PO Take by mouth 2 (two) times daily.   Yes Historical Provider, MD      ROS: The patient denies fevers, chills, night sweats, unintentional weight loss, chest pain, palpitations, wheezing, dyspnea on exertion, nausea, vomiting, abdominal pain, dysuria, hematuria, melena, numbness, weakness, or tingling. ***  All other systems have been reviewed and were otherwise negative with the exception of those mentioned in the HPI and as above.    PHYSICAL EXAM: Filed Vitals:   09/29/13 1352  BP: 138/89  Pulse: 73  Temp: 98.2 F (36.8 C)  Resp: 18   Filed Vitals:   09/29/13 1352  Height: 5\' 5"  (1.651 m)  Weight: 176 lb 3.2 oz (79.924 kg)   Body mass index is 29.32 kg/(m^2).  General: Alert, no acute distress HEENT:  Normocephalic, atraumatic, oropharynx patent. EOMI, PERRLA Cardiovascular:  Regular rate and rhythm, no rubs murmurs or gallops.  No Carotid bruits, radial pulse intact. No pedal edema.  Respiratory: Clear to auscultation bilaterally.  No wheezes, rales, or rhonchi.  No cyanosis, no use of accessory musculature GI: No organomegaly, abdomen is soft and non-tender, positive bowel sounds.  No masses. Skin: No rashes. Neurologic: Facial musculature symmetric. Psychiatric: Patient is appropriate throughout our interaction. Lymphatic: No cervical  lymphadenopathy Musculoskeletal: Gait intact.   LABS: Results for orders placed in visit on 09/12/13  CBC & DIFF AND RETIC      Result Value Range   WBC 6.0  3.9 - 10.3 10e3/uL   NEUT# 3.7  1.5 - 6.5 10e3/uL   HGB 14.0  11.6 - 15.9 g/dL   HCT 40.9  81.1 - 91.4 %   Platelets 282  145 - 400 10e3/uL   MCV 91.6  79.5 - 101.0 fL   MCH 30.8  25.1 - 34.0 pg   MCHC 33.7  31.5 - 36.0 g/dL   RBC 7.82  9.56 - 2.13 10e6/uL   RDW 13.7  11.2 - 14.5 %   lymph# 1.7  0.9 - 3.3 10e3/uL   MONO# 0.5  0.1 - 0.9 10e3/uL   Eosinophils Absolute 0.1  0.0 - 0.5 10e3/uL   Basophils Absolute 0.1  0.0 - 0.1 10e3/uL   NEUT% 62.0  38.4 - 76.8 %   LYMPH% 27.7  14.0 - 49.7 %   MONO% 7.6  0.0 - 14.0 %   EOS% 1.5  0.0 - 7.0  %   BASO% 1.2  0.0 - 2.0 %   nRBC 0  0 - 0 %   Retic % 1.88  0.70 - 2.10 %   Retic Ct Abs 85.35  33.70 - 90.70 10e3/uL   Immature Retic Fract 6.10  1.60 - 10.00 %  FERRITIN [facility]      Result Value Range   Ferritin 144  9 - 269 ng/ml  IRON AND TIBC [facility]      Result Value Range   Iron 155 (*) 41 - 142 ug/dL   TIBC 086  578 - 469 ug/dL   UIBC 629  528 - 413 ug/dL   %SAT 55  21 - 57 %  VITAMIN B12      Result Value Range   Vitamin B-12 310  211 - 911 pg/mL     EKG/XRAY:   Primary read interpreted by Dr. Conley Rolls at [facility].   ASSESSMENT/PLAN: No diagnosis found.   Gross sideeffects, risk and benefits, and alternatives of medications d/w patient. Patient is aware that all medications have potential sideeffects and we are unable to predict every sideeffect or drug-drug interaction that may occur.  Ronaldo Miyamoto, [place] 09/29/2013 3:12 PM        Review of Systems     Objective:   Physical Exam        Assessment & Plan:

## 2013-09-30 ENCOUNTER — Telehealth: Payer: Self-pay | Admitting: Family Medicine

## 2013-09-30 LAB — COMPLETE METABOLIC PANEL WITH GFR
ALT: 23 U/L (ref 0–35)
Albumin: 4.9 g/dL (ref 3.5–5.2)
Alkaline Phosphatase: 81 U/L (ref 39–117)
BUN: 8 mg/dL (ref 6–23)
CO2: 25 mEq/L (ref 19–32)
Calcium: 9.9 mg/dL (ref 8.4–10.5)
Chloride: 101 mEq/L (ref 96–112)
GFR, Est African American: >89 mL/min
GFR, Est Non African American: >89 mL/min
Glucose, Bld: 94 mg/dL (ref 70–99)
Potassium: 3.6 mEq/L (ref 3.5–5.3)
Sodium: 139 mEq/L (ref 135–145)
Total Bilirubin: 0.6 mg/dL (ref 0.3–1.2)

## 2013-09-30 LAB — COMPLETE METABOLIC PANEL WITHOUT GFR
AST: 18 U/L (ref 0–37)
Creat: 0.74 mg/dL (ref 0.50–1.10)
Total Protein: 7.4 g/dL (ref 6.0–8.3)

## 2013-09-30 LAB — LIPASE: Lipase: 47 U/L (ref 0–75)

## 2013-09-30 NOTE — Telephone Encounter (Signed)
Spoke to patient about labs, she has not had BM but not worse. Gave precautions  to go to ER prn

## 2013-10-02 ENCOUNTER — Ambulatory Visit (INDEPENDENT_AMBULATORY_CARE_PROVIDER_SITE_OTHER): Payer: BC Managed Care – PPO | Admitting: Internal Medicine

## 2013-10-02 VITALS — BP 126/80 | HR 67 | Temp 97.1°F | Resp 16 | Ht 64.75 in | Wt 175.8 lb

## 2013-10-02 DIAGNOSIS — R109 Unspecified abdominal pain: Secondary | ICD-10-CM

## 2013-10-02 LAB — POCT URINALYSIS DIPSTICK
Ketones, UA: >=160
Protein, UA: 30
Spec Grav, UA: 1.025
pH, UA: 6

## 2013-10-02 LAB — POCT CBC
HCT, POC: 43.9 % (ref 37.7–47.9)
Lymph, poc: 1.7 (ref 0.6–3.4)
MCHC: 31.7 g/dL — AB (ref 31.8–35.4)
MCV: 97.6 fL — AB (ref 80–97)
POC LYMPH PERCENT: 16.6 %L (ref 10–50)
RBC: 4.5 M/uL (ref 4.04–5.48)
RDW, POC: 15.4 %
WBC: 10.1 10*3/uL (ref 4.6–10.2)

## 2013-10-02 LAB — POCT UA - MICROSCOPIC ONLY
Casts, Ur, LPF, POC: NEGATIVE
Crystals, Ur, HPF, POC: NEGATIVE

## 2013-10-02 MED ORDER — SUCRALFATE 1 GM/10ML PO SUSP: ORAL | Status: DC

## 2013-10-02 NOTE — Progress Notes (Addendum)
Subjective:    Patient ID: Abigail [name], female    DOB: Jun 07, 1976, 37 y.o.   MRN: [id number]  HPI This chart was scribed for [facility] by Smiley Houseman, Scribe. This patient was seen in room 5 and the patient's care was started at 2:18 PM.  HPI Comments: Abigail [name] is a 37 y.o. female who presents to the [facility] complaining of abdominal pain that started about 6 days ago.  She states that when she drinks fluids, it feels like it hits a knot in her stomach and she becomes nauseated or vomits.  She also complains of constipation and generalized weakness.  She has tried Regalin, Miralax, and enemas without relief.  She denies a fever and leg swelling.  Pt states she did have diarrhea five days before constipation started. She has a h/o gastric bypass surgery and states diarrhea is normal for her since the surgery.  Pt has a h/o of cholecystectomy.      Past Surgical History  Procedure Laterality Date  . Gastric bypass  2003  . Cholecystectomy    . Tubal ligation      Family History  Problem Relation Age of Onset  . Diabetes Father   . Heart failure Father   . Cancer Maternal Grandfather     colon    History   Social History  . Marital Status: Single    Spouse Name: N/A    Number of Children: 2  . Years of Education: N/A   Occupational History  .  [employer]   Social History Main Topics  . Smoking status: Never Smoker   . Smokeless tobacco: Never Used  . Alcohol Use: Yes     Comment: twice monthly  . Drug Use: No  . Sexual Activity: Not on file   Other Topics Concern  . Not on file   Social History Narrative  . No narrative on file    No Known Allergies    Results for orders placed in visit on 09/29/13  LIPASE      Result Value Range   Lipase 47  0 - 75 U/L  COMPLETE METABOLIC PANEL WITH GFR      Result Value Range   Sodium 139  135 - 145 mEq/L   Potassium 3.6  3.5 - 5.3 mEq/L   Chloride  101  96 - 112 mEq/L   CO2 25  19 - 32 mEq/L   Glucose, Bld 94  70 - 99 mg/dL   BUN 8  6 - 23 mg/dL   Creat 5.62  1.30 - 8.65 mg/dL   Total Bilirubin 0.6  0.3 - 1.2 mg/dL   Alkaline Phosphatase 81  39 - 117 U/L   AST 18  0 - 37 U/L   ALT 23  0 - 35 U/L   Total Protein 7.4  6.0 - 8.3 g/dL   Albumin 4.9  3.5 - 5.2 g/dL   Calcium 9.9  8.4 - 78.4 mg/dL   GFR, Est African American >89     GFR, Est Non African American >89    POCT CBC      Result Value Range   WBC 9.8  4.6 - 10.2 K/uL   Lymph, poc 1.0  0.6 - 3.4   POC LYMPH PERCENT 10.4  10 - 50 %L   MID (cbc) 0.4  0 - 0.9   POC MID % 4.0  0 - 12 %  M   POC Granulocyte 8.4 (*) 2 - 6.9   Granulocyte percent 85.6 (*) 37 - 80 %G   RBC 4.89  4.04 - 5.48 M/uL   Hemoglobin 15.4  12.2 - 16.2 g/dL   HCT, POC 27.2  53.6 - 47.9 %   MCV 97.9 (*) 80 - 97 fL   MCH, POC 31.5 (*) 27 - 31.2 pg   MCHC 32.2  31.8 - 35.4 g/dL   RDW, POC 64.4     Platelet Count, POC 346  142 - 424 K/uL   MPV 9.0  0 - 99.8 fL  POCT UA - MICROSCOPIC ONLY      Result Value Range   WBC, Ur, HPF, POC 0-6     RBC, urine, microscopic 0-3     Bacteria, U Microscopic small     Mucus, UA large     Epithelial cells, urine per micros 0-14     Crystals, Ur, HPF, POC neg     Casts, Ur, LPF, POC neg     Yeast, UA neg    POCT URINALYSIS DIPSTICK      Result Value Range   Color, UA yellow     Clarity, UA clear     Glucose, UA neg     Bilirubin, UA moderate     Ketones, UA >=160 mg/dl     Spec Grav, UA >=0.347     Blood, UA neg     pH, UA 5.5     Protein, UA 30     Urobilinogen, UA 0.2     Nitrite, UA neg     Leukocytes, UA Negative        Review of Systems  Constitutional: Negative for fever and chills.  HENT: Negative for congestion and rhinorrhea.   Respiratory: Negative for cough and shortness of breath.   Cardiovascular: Negative for chest pain.  Gastrointestinal: Positive for nausea, vomiting, abdominal pain and diarrhea.  Genitourinary: Negative for  dysuria.  Musculoskeletal: Negative for back pain.  Skin: Negative for color change and rash.  Neurological: Negative for syncope.  All other systems reviewed and are negative.       Objective:   Physical Exam  Nursing note and vitals reviewed. Constitutional: She is oriented to person, place, and time. She appears well-developed and well-nourished. No distress.  HENT:  Head: Normocephalic and atraumatic.  Eyes: Conjunctivae and EOM are normal. Right eye exhibits no discharge. Left eye exhibits no discharge.  Neck: Normal range of motion. No thyromegaly present.  Cardiovascular: Normal rate, regular rhythm, normal heart sounds and intact distal pulses.   No murmur heard. Pulmonary/Chest: Effort normal and breath sounds normal. No respiratory distress. She has no wheezes.  Abdominal: Soft. Bowel sounds are normal. She exhibits no distension and no mass. There is tenderness. There is no rebound and no guarding.  Mild tenderness in right upper quadrant right middle quadrant and right lower quadrant to exam without rebound  Musculoskeletal: Normal range of motion. She exhibits no edema.  Lymphadenopathy:    She has no cervical adenopathy.  Neurological: She is alert and oriented to person, place, and time. No cranial nerve deficit.  Skin: Skin is warm and dry.  Psychiatric: She has a normal mood and affect. Her behavior is normal.    Triage Vitals: BP 126/80  Pulse 67  Temp(Src) 97.1 F (36.2 C) (Oral)  Resp 16  Ht 5' 4.75" (1.645 m)  Wt 175 lb 12.8 oz (79.742 kg)  BMI 29.47 kg/m2  SpO2  98%  LMP 09/27/2013  DIAGNOSTIC STUDIES: Oxygen Saturation is 98% on RA, normal by my interpretation.    Reviewed x-rays from last visit indicating no intestinal obstruction  Results for orders placed in visit on 10/02/13  POCT CBC      Result Value Range   WBC 10.1  4.6 - 10.2 K/uL   Lymph, poc 1.7  0.6 - 3.4   POC LYMPH PERCENT 16.6  10 - 50 %L   MID (cbc) 0.8  0 - 0.9   POC MID %  7.5  0 - 12 %M   POC Granulocyte 7.7 (*) 2 - 6.9   Granulocyte percent 75.9  37 - 80 %G   RBC 4.50  4.04 - 5.48 M/uL   Hemoglobin 13.9  12.2 - 16.2 g/dL   HCT, POC 45.4  09.8 - 47.9 %   MCV 97.6 (*) 80 - 97 fL   MCH, POC 30.9  27 - 31.2 pg   MCHC 31.7 (*) 31.8 - 35.4 g/dL   RDW, POC 11.9     Platelet Count, POC 311  142 - 424 K/uL   MPV 8.9  0 - 99.8 fL  POCT URINALYSIS DIPSTICK      Result Value Range   Color, UA yellow     Clarity, UA clear     Glucose, UA neg     Bilirubin, UA large     Ketones, UA >=160     Spec Grav, UA 1.025     Blood, UA trace     pH, UA 6.0     Protein, UA 30     Urobilinogen, UA 1.0     Nitrite, UA neg     Leukocytes, UA Negative    POCT UA - MICROSCOPIC ONLY      Result Value Range   WBC, Ur, HPF, POC 0-3     RBC, urine, microscopic 3-5     Bacteria, U Microscopic 2+     Mucus, UA pos     Epithelial cells, urine per micros 2-3     Crystals, Ur, HPF, POC neg     Casts, Ur, LPF, POC neg     Yeast, UA pos         Assessment & Plan:    I have completed the patient encounter in its entirety as documented by the scribe, with editing by me where necessary. Kerline Trahan P. Merla Riches, M.D.   Abdominal pain, unspecified site - Plan: POCT CBC, POCT urinalysis dipstick, POCT UA - Microscopic Only  this suggest 2 separate processes, one upper abdominal pain related to reflux, dysphagia, vomiting, and right upper quadrant epigastric pain /and the other related to bloating and lower abdominal pain with an x-ray that suggests constipation  In view of all labs negative will Proceed to CT scan and then endoscopy  if all is negative Meds ordered this encounter  Medications  . sucralfate (CARAFATE) 1 GM/10ML suspension    Sig: 10cc qid    Dispense:  420 mL    Refill:  0

## 2013-10-04 ENCOUNTER — Ambulatory Visit
Admission: RE | Admit: 2013-10-04 | Discharge: 2013-10-04 | Disposition: A | Discharge status: Home / Self Care | Payer: Self-pay | Source: Ambulatory Visit | Attending: Internal Medicine | Admitting: Internal Medicine

## 2013-10-04 DIAGNOSIS — R109 Unspecified abdominal pain: Secondary | ICD-10-CM

## 2013-10-04 MED ORDER — IOHEXOL 300 MG/ML  SOLN
100.0000 mL | Freq: Once | INTRAMUSCULAR | Status: AC | PRN
  Administered 2013-10-04: 100 mL via INTRAVENOUS

## 2013-10-05 ENCOUNTER — Telehealth: Payer: Self-pay

## 2013-10-05 NOTE — Telephone Encounter (Signed)
PT STATES SHE WAS TOLD WE WOULD CALL TODAY REGARDING HER RESULTS. IS REALLY ANXIOUS AND DOESN'T WANT TO WAIT UNTIL DR Merla Riches COMES BACK ON Friday PLEASE CALL [phone]

## 2013-10-06 NOTE — Telephone Encounter (Signed)
Previous gastric bypass surgery of some sort of unconventional  anatomy to my evaluation. There is no obstruction, as previously  administered oral contrast has all passed into the colon. There are  some dilated loops of small intestine with questionable thick walls.  This could be due to enteritis or possibly due to a partial small  bowel obstruction. Oral contrast administration study may be useful  for understanding this better.  Probable 1 cm calcified granuloma in the left lower lobe.  Correlation with chest radiography or previous studies suggested.  What should I advise patient?

## 2013-10-06 NOTE — Telephone Encounter (Signed)
This patient has already been contacted with Dr. Netta Corriganoolittle's recommendation.

## 2013-10-19 DIAGNOSIS — Z9884 Bariatric surgery status: Secondary | ICD-10-CM | POA: Insufficient documentation

## 2013-12-12 ENCOUNTER — Encounter: Payer: Self-pay | Admitting: Internal Medicine

## 2014-01-10 ENCOUNTER — Telehealth: Payer: Self-pay | Admitting: Hematology and Oncology

## 2014-01-10 NOTE — Telephone Encounter (Signed)
, °

## 2014-01-11 ENCOUNTER — Other Ambulatory Visit: Payer: BC Managed Care – PPO

## 2014-01-11 ENCOUNTER — Ambulatory Visit: Payer: BC Managed Care – PPO | Admitting: Hematology and Oncology

## 2014-01-25 ENCOUNTER — Ambulatory Visit: Payer: BC Managed Care – PPO | Admitting: Internal Medicine

## 2014-02-13 ENCOUNTER — Ambulatory Visit (HOSPITAL_BASED_OUTPATIENT_CLINIC_OR_DEPARTMENT_OTHER): Payer: BC Managed Care – PPO | Admitting: Hematology and Oncology

## 2014-02-13 ENCOUNTER — Other Ambulatory Visit (HOSPITAL_BASED_OUTPATIENT_CLINIC_OR_DEPARTMENT_OTHER): Payer: BC Managed Care – PPO

## 2014-02-13 ENCOUNTER — Telehealth: Payer: Self-pay | Admitting: *Deleted

## 2014-02-13 VITALS — BP 109/76 | HR 78 | Temp 98.0°F | Resp 18 | Ht 64.0 in | Wt 184.2 lb

## 2014-02-13 DIAGNOSIS — E559 Vitamin D deficiency, unspecified: Secondary | ICD-10-CM

## 2014-02-13 DIAGNOSIS — D509 Iron deficiency anemia, unspecified: Secondary | ICD-10-CM

## 2014-02-13 DIAGNOSIS — Z9884 Bariatric surgery status: Secondary | ICD-10-CM

## 2014-02-13 DIAGNOSIS — E538 Deficiency of other specified B group vitamins: Secondary | ICD-10-CM

## 2014-02-13 DIAGNOSIS — N920 Excessive and frequent menstruation with regular cycle: Secondary | ICD-10-CM

## 2014-02-13 DIAGNOSIS — Z9889 Other specified postprocedural states: Secondary | ICD-10-CM

## 2014-02-13 LAB — CBC & DIFF AND RETIC
BASO%: 0.8 % (ref 0.0–2.0)
Basophils Absolute: 0 10*3/uL (ref 0.0–0.1)
EOS ABS: 0.1 10*3/uL (ref 0.0–0.5)
EOS%: 1.2 % (ref 0.0–7.0)
HCT: 40 % (ref 34.8–46.6)
HEMOGLOBIN: 13.9 g/dL (ref 11.6–15.9)
IMMATURE RETIC FRACT: 4.5 % (ref 1.60–10.00)
LYMPH#: 1.8 10*3/uL (ref 0.9–3.3)
LYMPH%: 36.6 % (ref 14.0–49.7)
MCH: 32.4 pg (ref 25.1–34.0)
MCHC: 34.8 g/dL (ref 31.5–36.0)
MCV: 93.2 fL (ref 79.5–101.0)
MONO#: 0.4 10*3/uL (ref 0.1–0.9)
MONO%: 7.6 % (ref 0.0–14.0)
NEUT%: 53.8 % (ref 38.4–76.8)
NEUTROS ABS: 2.7 10*3/uL (ref 1.5–6.5)
Platelets: 247 10*3/uL (ref 145–400)
RBC: 4.29 10*6/uL (ref 3.70–5.45)
RDW: 11.9 % (ref 11.2–14.5)
Retic %: 1.67 % (ref 0.70–2.10)
Retic Ct Abs: 71.64 10*3/uL (ref 33.70–90.70)
WBC: 5 10*3/uL (ref 3.9–10.3)

## 2014-02-13 LAB — FERRITIN CHCC: Ferritin: 31 ng/ml (ref 9–269)

## 2014-02-13 LAB — IRON AND TIBC CHCC
%SAT: 68 % — ABNORMAL HIGH (ref 21–57)
IRON: 201 ug/dL — AB (ref 41–142)
TIBC: 296 ug/dL (ref 236–444)
UIBC: 95 ug/dL — ABNORMAL LOW (ref 120–384)

## 2014-02-13 NOTE — Progress Notes (Signed)
[name] OFFICE PROGRESS NOTE  [name], [name], [name] DIAGNOSIS:  Chronic mineral deficiency including vitamin B12 deficiency, iron deficiency in vitamin D deficiency from history of gastric bypass  SUMMARY OF HEMATOLOGIC HISTORY: This is a pleasant 38 year old lady with background history of morbid obesity status post gastric bypass in 2003. She was found to have history of B12 and iron deficiency anemia and had received intravenous iron infusion in the past. In November 2014, she received one more dose of intravenous iron infusion INTERVAL HISTORY: Abigail [name] 38 y.o. female returns for further followup. She is compliant taking oral iron supplement. She also takes vitamin B12 and vitamin D supplement. She still has menorrhagia. She has not seen a gynecologist. She denies any pica or leg cramps.  I have reviewed the past medical history, past surgical history, social history and family history with the patient and they are unchanged from previous note.  ALLERGIES:  has No Known Allergies.  MEDICATIONS:  Current Outpatient Prescriptions  Medication Sig Dispense Refill  . ALPRAZolam (XANAX) 0.25 MG tablet Take 0.25 mg by mouth at bedtime as needed.      . IRON PO Take by mouth 2 (two) times daily.      . valACYclovir (VALTREX) 500 MG tablet Take 500 mg by mouth daily.       No current facility-administered medications for this visit.     REVIEW OF SYSTEMS:   Constitutional: Denies fevers, chills or night sweats Eyes: Denies blurriness of vision Ears, nose, mouth, throat, and face: Denies mucositis or sore throat Respiratory: Denies cough, dyspnea or wheezes Cardiovascular: Denies palpitation, chest discomfort or lower extremity swelling Gastrointestinal:  Denies nausea, heartburn or change in bowel habits Skin: Denies abnormal skin rashes Lymphatics: Denies new lymphadenopathy or easy bruising Neurological:Denies numbness, tingling or new  weaknesses Behavioral/Psych: Mood is stable, no new changes  All other systems were reviewed with the patient and are negative.  PHYSICAL EXAMINATION: ECOG PERFORMANCE STATUS: 0 - Asymptomatic  Filed Vitals:   02/13/14 0910  BP: 109/76  Pulse: 78  Temp: 98 F (36.7 C)  Resp: 18   Filed Weights   02/13/14 0910  Weight: 184 lb 3.2 oz (83.553 kg)    GENERAL:alert, no distress and comfortable. She is mildly obese SKIN: skin color, texture, turgor are normal, no rashes or significant lesions EYES: normal, Conjunctiva are pink and non-injected, sclera clear Musculoskeletal:no cyanosis of digits and no clubbing  NEURO: alert & oriented x 3 with fluent speech, no focal motor/sensory deficits  LABORATORY DATA:  I have reviewed the data as listed Results for orders placed in visit on 02/13/14 (from the past 48 hour(s))  CBC & DIFF AND RETIC     Status: None   Collection Time    02/13/14  9:03 AM      Result Value Ref Range   WBC 5.0  3.9 - 10.3 10e3/uL   NEUT# 2.7  1.5 - 6.5 10e3/uL   HGB 13.9  11.6 - 15.9 g/dL   HCT 46.940.0  62.934.8 - 52.846.6 %   Platelets 247  145 - 400 10e3/uL   MCV 93.2  79.5 - 101.0 fL   MCH 32.4  25.1 - 34.0 pg   MCHC 34.8  31.5 - 36.0 g/dL   RBC 4.134.29  2.443.70 - 0.105.45 10e6/uL   RDW 11.9  11.2 - 14.5 %   lymph# 1.8  0.9 - 3.3 10e3/uL   MONO# 0.4  0.1 - 0.9 10e3/uL  Eosinophils Absolute 0.1  0.0 - 0.5 10e3/uL   Basophils Absolute 0.0  0.0 - 0.1 10e3/uL   NEUT% 53.8  38.4 - 76.8 %   LYMPH% 36.6  14.0 - 49.7 %   MONO% 7.6  0.0 - 14.0 %   EOS% 1.2  0.0 - 7.0 %   BASO% 0.8  0.0 - 2.0 %   Retic % 1.67  0.70 - 2.10 %   Retic Ct Abs 71.64  33.70 - 90.70 10e3/uL   Immature Retic Fract 4.50  1.60 - 10.00 %  IRON AND TIBC [facility]     Status: Abnormal   Collection Time    02/13/14  9:03 AM      Result Value Ref Range   Iron 201 (*) 41 - 142 ug/dL   TIBC 161296  096236 - 045444 ug/dL   UIBC 95 (*) 409120 - 811384 ug/dL   %SAT 68 (*) 21 - 57 %  FERRITIN [facility]     Status: None    Collection Time    02/13/14  9:03 AM      Result Value Ref Range   Ferritin 31  9 - 269 ng/ml    Lab Results  Component Value Date   WBC 5.0 02/13/2014   HGB 13.9 02/13/2014   HCT 40.0 02/13/2014   MCV 93.2 02/13/2014   PLT 247 02/13/2014   ASSESSMENT & PLAN:  #1 history of iron deficiency anemia Her repeat CBC showed improvement of her hemoglobin. Ferritin level has dropped since her last visit. She will continue on oral iron supplement indefinitely. I recommend she has a blood work rechecked in 6 months. Due to recent blackbox warning, I will not prescribe intravenous iron infusion and that she become anemic. #2 history of B12 deficiency I recommend she takes oral B12 supplements. I will recheck of vitamin B12 level with a future visit. #3 history of gastric bypass I recommend she take vitamin D supplement #4 menorrhagia I discussed with her the importance of seeing the gynecologist should she become anemic again in the future.  All questions were answered. The patient knows to call the clinic with any problems, questions or concerns. No barriers to learning was detected.  I spent 15 minutes counseling the patient face to face. The total time spent in the appointment was 20 minutes and more than 50% was on counseling.     Artis DelayNi Quaniyah Bugh, MD 02/13/2014 1:12 PM

## 2014-02-13 NOTE — Telephone Encounter (Signed)
Message copied by Rolanda JayWINDHAM, PHONACELLE C on Mon Feb 13, 2014 12:50 PM ------      Message from: [facility] Medical Specialists AssociationGORSUCH, NI      Created: Mon Feb 13, 2014 12:22 PM      Regarding: ferritin level       Please let her know it had dropped from previous visit but still reasonable      No change in plan, recheck in 6 months with PCP as discussed      Thanks      ----- Message -----         From: [facility]         Sent: 02/13/2014   9:14 AM           To: Artis DelayNi Gorsuch, MD                   ------

## 2014-02-13 NOTE — Telephone Encounter (Signed)
Left VM for pt to return nurse's call.  

## 2014-02-14 ENCOUNTER — Telehealth: Payer: Self-pay | Admitting: *Deleted

## 2014-02-14 ENCOUNTER — Telehealth: Payer: Self-pay | Admitting: Hematology and Oncology

## 2014-02-14 NOTE — Telephone Encounter (Signed)
lvm for pt regardig to May 2016 appt...mailed pt appt sched/avs and letter

## 2014-02-14 NOTE — Telephone Encounter (Signed)
Informed pt of Ferritin level dropped since last time, but still reasonable per Dr. Bertis RuddyGorsuch and to f/u in 6 months as planned.  She verbalized understanding.

## 2014-06-19 ENCOUNTER — Other Ambulatory Visit (HOSPITAL_COMMUNITY): Payer: Self-pay | Admitting: Obstetrics

## 2014-06-19 DIAGNOSIS — Z1231 Encounter for screening mammogram for malignant neoplasm of breast: Secondary | ICD-10-CM

## 2014-06-26 ENCOUNTER — Ambulatory Visit (HOSPITAL_COMMUNITY)
Admission: RE | Admit: 2014-06-26 | Discharge: 2014-06-26 | Disposition: A | Discharge status: Home / Self Care | Payer: BC Managed Care – PPO | Source: Ambulatory Visit | Attending: Obstetrics | Admitting: Obstetrics

## 2014-06-26 ENCOUNTER — Other Ambulatory Visit (HOSPITAL_COMMUNITY): Payer: Self-pay | Admitting: Obstetrics

## 2014-06-26 DIAGNOSIS — Z1231 Encounter for screening mammogram for malignant neoplasm of breast: Secondary | ICD-10-CM | POA: Diagnosis present

## 2014-06-28 ENCOUNTER — Other Ambulatory Visit: Payer: Self-pay | Admitting: Obstetrics

## 2014-07-28 NOTE — Patient Instructions (Addendum)
   Your procedure is scheduled on:  Wednesday, Nov 4  [facility]  6 AM Pick up the phone at the desk and dial [phone] and inform us of your arrival.  Please call this number if you have any problems the morning of surgery: [phone]  Remember: Do not eat or drink after midnight: Tuesday Take these medicines the morning of surgery with a SIP OF WATER: Valtrex and Xanax if needed  Do not wear jewelry, make-up, or FINGER nail polish No metal in your hair or on your body. Do not wear lotions, powders, perfumes.  You may wear deodorant.  Do not bring valuables to the hospital. Contacts, dentures or bridgework may not be worn into surgery.  Patients discharged on the day of surgery will not be allowed to drive home.  Home with mother Thamas JaegersKathy Loyd cell [phone].

## 2014-07-31 ENCOUNTER — Encounter (HOSPITAL_COMMUNITY): Payer: Self-pay

## 2014-07-31 ENCOUNTER — Encounter (HOSPITAL_COMMUNITY)
Admission: RE | Admit: 2014-07-31 | Discharge: 2014-07-31 | Disposition: A | Discharge status: Home / Self Care | Payer: BC Managed Care – PPO | Source: Ambulatory Visit | Attending: Obstetrics | Admitting: Obstetrics

## 2014-07-31 ENCOUNTER — Encounter (HOSPITAL_COMMUNITY): Payer: Self-pay | Admitting: Pharmacist

## 2014-07-31 DIAGNOSIS — Z01812 Encounter for preprocedural laboratory examination: Secondary | ICD-10-CM | POA: Diagnosis not present

## 2014-07-31 HISTORY — DX: Nausea with vomiting, unspecified: R11.2

## 2014-07-31 HISTORY — DX: Other specified postprocedural states: Z98.890

## 2014-07-31 LAB — CBC
HCT: 40.2 % (ref 36.0–46.0)
Hemoglobin: 14 g/dL (ref 12.0–15.0)
MCH: 33.7 pg (ref 26.0–34.0)
MCHC: 34.8 g/dL (ref 30.0–36.0)
MCV: 96.6 fL (ref 78.0–100.0)
Platelets: 242 10*3/uL (ref 150–400)
RBC: 4.16 MIL/uL (ref 3.87–5.11)
RDW: 11.6 % (ref 11.5–15.5)
WBC: 9.1 10*3/uL (ref 4.0–10.5)

## 2014-08-09 ENCOUNTER — Ambulatory Visit (HOSPITAL_COMMUNITY): Payer: BC Managed Care – PPO | Admitting: Anesthesiology

## 2014-08-09 ENCOUNTER — Encounter (HOSPITAL_COMMUNITY)
Admission: RE | Disposition: A | Discharge status: Home / Self Care | Payer: Self-pay | Source: Ambulatory Visit | Attending: Obstetrics

## 2014-08-09 ENCOUNTER — Ambulatory Visit (HOSPITAL_COMMUNITY)
Admission: RE | Admit: 2014-08-09 | Discharge: 2014-08-09 | Disposition: A | Discharge status: Home / Self Care | Payer: BC Managed Care – PPO | Source: Ambulatory Visit | Attending: Obstetrics | Admitting: Obstetrics

## 2014-08-09 ENCOUNTER — Encounter (HOSPITAL_COMMUNITY): Payer: Self-pay | Admitting: Anesthesiology

## 2014-08-09 DIAGNOSIS — I10 Essential (primary) hypertension: Secondary | ICD-10-CM | POA: Diagnosis not present

## 2014-08-09 DIAGNOSIS — Z9884 Bariatric surgery status: Secondary | ICD-10-CM | POA: Insufficient documentation

## 2014-08-09 DIAGNOSIS — N938 Other specified abnormal uterine and vaginal bleeding: Secondary | ICD-10-CM | POA: Insufficient documentation

## 2014-08-09 HISTORY — PX: DILITATION & CURRETTAGE/HYSTROSCOPY WITH HYDROTHERMAL ABLATION: SHX5570

## 2014-08-09 SURGERY — DILATATION & CURETTAGE/HYSTEROSCOPY WITH HYDROTHERMAL ABLATION
Anesthesia: General | Site: Uterus

## 2014-08-09 MED ORDER — LACTATED RINGERS IV SOLN
INTRAVENOUS | Status: DC
Start: 2014-08-09 — End: 2014-08-09
  Administered 2014-08-09 (×3): via INTRAVENOUS

## 2014-08-09 MED ORDER — SCOPOLAMINE 1 MG/3DAYS TD PT72
1.0000 | MEDICATED_PATCH | Freq: Once | TRANSDERMAL | Status: DC
  Administered 2014-08-09: 1.5 mg via TRANSDERMAL

## 2014-08-09 MED ORDER — FENTANYL CITRATE 0.05 MG/ML IJ SOLN
25.0000 ug | INTRAMUSCULAR | Status: DC | PRN
  Administered 2014-08-09 (×3): 50 ug via INTRAVENOUS

## 2014-08-09 MED ORDER — MIDAZOLAM HCL 2 MG/2ML IJ SOLN
INTRAMUSCULAR | Status: DC | PRN
  Administered 2014-08-09: 2 mg via INTRAVENOUS

## 2014-08-09 MED ORDER — PROPOFOL 10 MG/ML IV BOLUS
INTRAVENOUS | Status: DC | PRN
  Administered 2014-08-09: 150 mg via INTRAVENOUS

## 2014-08-09 MED ORDER — KETOROLAC TROMETHAMINE 30 MG/ML IJ SOLN
INTRAMUSCULAR | Status: DC | PRN
  Administered 2014-08-09: 30 mg via INTRAVENOUS

## 2014-08-09 MED ORDER — MIDAZOLAM HCL 2 MG/2ML IJ SOLN
INTRAMUSCULAR | Status: AC
  Filled 2014-08-09: qty 2

## 2014-08-09 MED ORDER — DEXAMETHASONE SODIUM PHOSPHATE 10 MG/ML IJ SOLN
INTRAMUSCULAR | Status: AC
  Filled 2014-08-09: qty 1

## 2014-08-09 MED ORDER — ONDANSETRON HCL 4 MG/2ML IJ SOLN
INTRAMUSCULAR | Status: DC | PRN
  Administered 2014-08-09: 4 mg via INTRAVENOUS

## 2014-08-09 MED ORDER — DEXAMETHASONE SODIUM PHOSPHATE 4 MG/ML IJ SOLN
INTRAMUSCULAR | Status: DC | PRN
  Administered 2014-08-09: 4 mg via INTRAVENOUS

## 2014-08-09 MED ORDER — FENTANYL CITRATE 0.05 MG/ML IJ SOLN
INTRAMUSCULAR | Status: AC
  Filled 2014-08-09: qty 2

## 2014-08-09 MED ORDER — SCOPOLAMINE 1 MG/3DAYS TD PT72
MEDICATED_PATCH | TRANSDERMAL | Status: AC
  Administered 2014-08-09: 06:00:00 1.5 mg via TRANSDERMAL
  Filled 2014-08-09: qty 1

## 2014-08-09 MED ORDER — ONDANSETRON HCL 4 MG/2ML IJ SOLN
INTRAMUSCULAR | Status: AC
  Filled 2014-08-09: qty 2

## 2014-08-09 MED ORDER — KETOROLAC TROMETHAMINE 30 MG/ML IJ SOLN
INTRAMUSCULAR | Status: AC
  Filled 2014-08-09: qty 1

## 2014-08-09 MED ORDER — SODIUM CHLORIDE 0.9 % IR SOLN
Status: DC | PRN
  Administered 2014-08-09: 3000 mL

## 2014-08-09 MED ORDER — PROPOFOL 10 MG/ML IV EMUL
INTRAVENOUS | Status: AC
  Filled 2014-08-09: qty 20

## 2014-08-09 MED ORDER — OXYCODONE-ACETAMINOPHEN 5-325 MG PO TABS
ORAL_TABLET | ORAL | Status: AC
  Filled 2014-08-09: qty 1

## 2014-08-09 MED ORDER — LIDOCAINE HCL (CARDIAC) 20 MG/ML IV SOLN
INTRAVENOUS | Status: AC
Start: 2014-08-09 — End: 2014-08-09
  Filled 2014-08-09: qty 5

## 2014-08-09 MED ORDER — LIDOCAINE HCL 1 % IJ SOLN
INTRAMUSCULAR | Status: DC | PRN
  Administered 2014-08-09: 10 mL

## 2014-08-09 MED ORDER — LIDOCAINE HCL 1 % IJ SOLN
INTRAMUSCULAR | Status: AC
  Filled 2014-08-09: qty 20

## 2014-08-09 MED ORDER — DEXAMETHASONE SODIUM PHOSPHATE 4 MG/ML IJ SOLN
INTRAMUSCULAR | Status: AC
  Filled 2014-08-09: qty 1

## 2014-08-09 MED ORDER — LIDOCAINE HCL (CARDIAC) 20 MG/ML IV SOLN
INTRAVENOUS | Status: DC | PRN
  Administered 2014-08-09: 60 mg via INTRAVENOUS

## 2014-08-09 MED ORDER — OXYCODONE-ACETAMINOPHEN 5-325 MG PO TABS
1.0000 | ORAL_TABLET | ORAL | Status: DC | PRN
  Administered 2014-08-09: 1 via ORAL

## 2014-08-09 MED ORDER — FENTANYL CITRATE 0.05 MG/ML IJ SOLN
INTRAMUSCULAR | Status: DC | PRN
  Administered 2014-08-09 (×4): 50 ug via INTRAVENOUS

## 2014-08-09 MED ORDER — KETOROLAC TROMETHAMINE 30 MG/ML IJ SOLN: 15.0000 mg | Freq: Once | INTRAMUSCULAR | Status: DC | PRN

## 2014-08-09 MED ORDER — ONDANSETRON HCL 4 MG/2ML IJ SOLN: 4.0000 mg | Freq: Once | INTRAMUSCULAR | Status: DC | PRN

## 2014-08-09 MED ORDER — MEPERIDINE HCL 25 MG/ML IJ SOLN: 6.2500 mg | INTRAMUSCULAR | Status: DC | PRN

## 2014-08-09 SURGICAL SUPPLY — 12 items
CATH ROBINSON RED A/P 16FR (CATHETERS) ×3 IMPLANT
CLOTH BEACON ORANGE TIMEOUT ST (SAFETY) ×3 IMPLANT
CONTAINER PREFILL 10% NBF 60ML (FORM) ×6 IMPLANT
GLOVE BIO SURGEON STRL SZ8.5 (GLOVE) ×3 IMPLANT
GLOVE BIOGEL PI IND STRL 7.0 (GLOVE) IMPLANT
GLOVE BIOGEL PI INDICATOR 7.0 (GLOVE) ×4
GOWN STRL REUS W/TWL 2XL LVL3 (GOWN DISPOSABLE) ×3 IMPLANT
GOWN STRL REUS W/TWL LRG LVL3 (GOWN DISPOSABLE) ×3 IMPLANT
PACK VAGINAL MINOR WOMEN LF (CUSTOM PROCEDURE TRAY) ×3 IMPLANT
PAD OB MATERNITY 4.3X12.25 (PERSONAL CARE ITEMS) ×3 IMPLANT
SET GENESYS HTA PROCERVA (MISCELLANEOUS) ×3 IMPLANT
TOWEL OR 17X24 6PK STRL BLUE (TOWEL DISPOSABLE) ×6 IMPLANT

## 2014-08-09 NOTE — Op Note (Signed)
preop diagnosis dysfunctional uterine bleeding Postop diagnosis is same Procedure hysteroscopy D&C and HTA Anesthesia general Procedure  Under  general anesthesia patient in lithotomy position perineum and vagina prepped and draped uterus is top normal size negative adnexa speculum placed in the vagina and the cervix injected with 9 cc of 1% Xylocaine anterior lip of the cervix grasped with a tenaculum the endometrial cavity sounded 9 cm the cervix dilated to #25 Shawnie PonsPratt and the hysteroscope inserted   3 polyps in the endometrial cavity and a hysteroscopy was terminated a sharp curettage was performed large amount of tissue obtained and then the HTA device was inserted heated to 90 and the integrity test was past and ablation occurred for 10 minutes cooldown period 2 minutes and the HTA device removed the patient tolerated the procedure well taken to recovery room in good condition

## 2014-08-09 NOTE — Transfer of Care (Signed)
Immediate Anesthesia Transfer of Care Note  Patient: Abigail [name]  Procedure(s) Performed: Procedure(s): DILATATION & CURETTAGE/HYSTEROSCOPY WITH HYDROTHERMAL ABLATION (N/A)  Patient Location: PACU  Anesthesia Type:General  Level of Consciousness: awake, alert , oriented and patient cooperative  Airway & Oxygen Therapy: Patient Spontanous Breathing and Patient connected to nasal cannula oxygen  Post-op Assessment: Report given to PACU RN and Post -op Vital signs reviewed and stable  Post vital signs: Reviewed and stable  Complications: No apparent anesthesia complications

## 2014-08-09 NOTE — H&P (Signed)
NAME:  Abigail [name], Abigail [name]              ACCOUNT NO.:  [id number]  MEDICAL RECORD NO.:  [id number]  LOCATION:                                 FACILITY:  PHYSICIAN:  Kathreen CosierBernard A. Shaunda Tipping, M.D.DATE OF BIRTH:  [birth year]  DATE OF ADMISSION: DATE OF DISCHARGE:                             HISTORY & PHYSICAL   The patient is a 38 year old, gravida 3, para 2-0-0-1-2, with periods lasting 12 days heavy, and has to get infusions of iron once a year, she desires D and C and HTA.  PAST MEDICAL HISTORY:  History of hypertension, no meds.  PAST SURGICAL HISTORY:  She had a gastric bypass and breast implants in the past.  SOCIAL HISTORY:  Negative.  SYSTEM REVIEW:  Negative.  PHYSICAL EXAMINATION:  GENERAL:  A well-developed female in no distress. HEENT:  Negative. LUNGS:  Clear to P and A. HEART:  Regular rhythm.  No murmurs, no gallops. ABDOMEN:  Negative. PELVIC:  Uterus normal.  Negative adnexa.  Pap smear negative. EXTERNAL GENITALIA:  Negative.          ______________________________ Kathreen CosierBernard A. Linlee Cromie, M.D.     BAM/MEDQ  D:  08/07/2014  T:  08/07/2014  Job:  [id number]

## 2014-08-09 NOTE — Anesthesia Postprocedure Evaluation (Signed)
  Anesthesia Post-op Note  Patient: Abigail [name]  Procedure(s) Performed: Procedure(s): DILATATION & CURETTAGE/HYSTEROSCOPY WITH HYDROTHERMAL ABLATION (N/A) Patient is awake and responsive. Pain and nausea are reasonably well controlled. Vital signs are stable and clinically acceptable. Oxygen saturation is clinically acceptable. There are no apparent anesthetic complications at this time. Patient is ready for discharge.

## 2014-08-09 NOTE — H&P (Signed)
  There has been no change in her  History and physical since the original dictation

## 2014-08-09 NOTE — Discharge Instructions (Signed)
D&C Hysterosocpy Care After Read the instructions below. Refer to this sheet in the next few weeks. These instructions provide you with general information on caring for yourself after you leave the hospital. Your caregiver may also give you specific instructions.  D&C or vacuum curettage is a minor operation. A D&C involves the stretching (dilatation) of the cervix and scraping (curettage) of the inside lining of the uterus. A vacuum curettage gently sucks out the lining and tissue in the uterus with a tube. You may have light cramping and bleeding for a couple of days to two weeks after the procedure. This procedure may be done in a hospital, outpatient clinic, or doctor's office. You may be given a drug to make you sleep (general anesthetic) or a drug that numbs the area (local anesthetic) in and around the cervix. HOME CARE INSTRUCTIONS  Do not drive for 24 hours.   Wait one week before returning to strenuous activities.   Take your temperature two times a day for 4 days and write it down. Provide these temperatures to your caregiver if they are abnormal (above 98.6 F or 37.0 C).   Avoid long periods of standing, and do no heavy lifting (more than 10 pounds), pushing or pulling.   Limit stair climbing to once or twice a day.   Take rest periods often.   You may resume your usual diet.   Drink plenty of fluids (6-8 glasses a day).   You should return to your usual bowel function. If constipation should occur, you may:   Take a mild laxative with permission from your caregiver.   Add fruit and bran to your diet.   Drink more fluids. This helps with constipation.   Take showers instead of baths until your caregiver gives you permission to take baths.   Do not go swimming or use a hot tub until your caregiver gives you permission.   Try to have someone with you or available for you the first 24 to 48 hours, especially if you had a general anesthetic.   Do not douche, use  tampons, or have intercourse until after your follow-up appointment, or when your caregiver approves.   Only take over-the-counter or prescription medicines for pain, discomfort, or fever as directed by your caregiver. Do not take aspirin. It can cause bleeding.   If a prescription was given, follow your caregiver's directions. You may be given a medicine that kills germs (antibiotic) to prevent an infection.   Keep all your follow-up appointments recommended by your caregiver.  SEEK MEDICAL CARE IF:  You have increasing cramps or pain not relieved with medication.   You develop belly (abdominal) pain which does not seem to be related to the same area of earlier cramping and pain.   You feel dizzy or feel like fainting.   You have bad smelling vaginal discharge.   You develop a rash.   You develop a reaction or allergy to your medication.  SEEK IMMEDIATE MEDICAL CARE IF:  Bleeding is heavier than a normal menstrual period.   You have an oral temperature above 100.6, not controlled by medicine.   You develop chest pain.   You develop shortness of breath.   You pass out.   You develop pain in your shoulder strap area.   You develop heavy vaginal bleeding with or without blood clots.  MAKE SURE YOU:   Understand these instructions.   Will watch your condition.   Will get help right away if you are  not doing well or get worse.  UPDATED HEALTH PRACTICES  A PAP smear is done to screen for cervical cancer.   The first PAP smear should be done at age 39.   Between ages 4321 and 4729, PAP smears are repeated every 2 years.   Beginning at age 39, you are advised to have a PAP smear every 3 years as long as your past 3 PAP smears have been normal.   Some women have medical problems that increase the chance of getting cervical cancer. Talk to your caregiver about these problems. It is especially important to talk to your caregiver if a new problem develops soon after your last PAP  smear. In these cases, your caregiver may recommend more frequent screening and Pap smears.   The above recommendations are the same for women who have or have not gotten the vaccine for HPV (Human Papillomavirus).   If you had a hysterectomy for a problem that was not a cancer or a condition that could lead to cancer, then you no longer need Pap smears.   If you are between ages 9065 and 5970, and you have had normal Pap smears going back 10 years, you no longer need Pap smears.   If you have had past treatment for cervical cancer or a condition that could lead to cancer, you need Pap smears and screening for cancer for at least 20 years after your treatment.   Continue monthly self-breast examinations. Your caregiver can provide information and instructions for self-breast examination.  Document Released: 09/19/2000 Document Re-Released: 03/12/2010 [facility] Patient Information 2011 [employer], [place].    DO NOT TAKE ANY IBUPROFEN PRODUCTS UNTIL 2 PM TODAY.

## 2014-08-09 NOTE — Anesthesia Preprocedure Evaluation (Signed)
Anesthesia Evaluation  Patient identified by MRN, date of birth, ID band Patient awake    Reviewed: Allergy & Precautions, H&P , NPO status , Patient's Chart, lab work & pertinent test results, reviewed documented beta blocker date and time   Airway Mallampati: II  TM Distance: >3 FB Neck ROM: full    Dental no notable dental hx. (+) Teeth Intact   Pulmonary neg pulmonary ROS,    Pulmonary exam normal       Cardiovascular negative cardio ROS      Neuro/Psych negative neurological ROS     GI/Hepatic negative GI ROS, Neg liver ROS,   Endo/Other  negative endocrine ROS  Renal/GU negative Renal ROS     Musculoskeletal   Abdominal Normal abdominal exam  (+)   Peds  Hematology negative hematology ROS (+)   Anesthesia Other Findings   Reproductive/Obstetrics negative OB ROS                             Anesthesia Physical Anesthesia Plan  ASA: II  Anesthesia Plan: General   Post-op Pain Management:    Induction: Intravenous  Airway Management Planned: LMA  Additional Equipment:   Intra-op Plan:   Post-operative Plan:   Informed Consent: I have reviewed the patients History and Physical, chart, labs and discussed the procedure including the risks, benefits and alternatives for the proposed anesthesia with the patient or authorized representative who has indicated his/her understanding and acceptance.     Plan Discussed with: CRNA, Surgeon and Anesthesiologist  Anesthesia Plan Comments:         Anesthesia Quick Evaluation

## 2014-08-10 ENCOUNTER — Encounter (HOSPITAL_COMMUNITY): Payer: Self-pay | Admitting: Obstetrics

## 2014-08-16 ENCOUNTER — Other Ambulatory Visit: Payer: Self-pay | Admitting: Hematology and Oncology

## 2014-10-13 IMAGING — CT CT ABD-PELV W/ CM
2 of 4 series · 9 of 36 positions shown, 15 images · IV contrast (READICAT/WATER & [ID] OMNI 300)
Comparison: None.

CLINICAL DATA: Nausea and vomiting. Weight loss. Previous gastric
bypass.

EXAM:
CT ABDOMEN AND PELVIS WITH CONTRAST
TECHNIQUE: Multidetector CT imaging of the abdomen and pelvis was performed
using the standard protocol following bolus administration of
intravenous contrast.
CONTRAST:  100mL OMNIPAQUE IOHEXOL 300 MG/ML  SOLN

[Series 3: abd/pelvis with · axial · 0.74mm/px · z∈[-387,-62]mm · 8 of 82 slices shown, 13 images]
[im 10/82  soft-tissue]
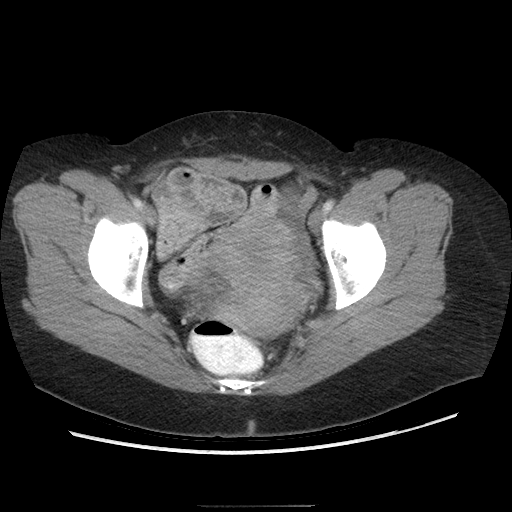
[im 10/82  bone]
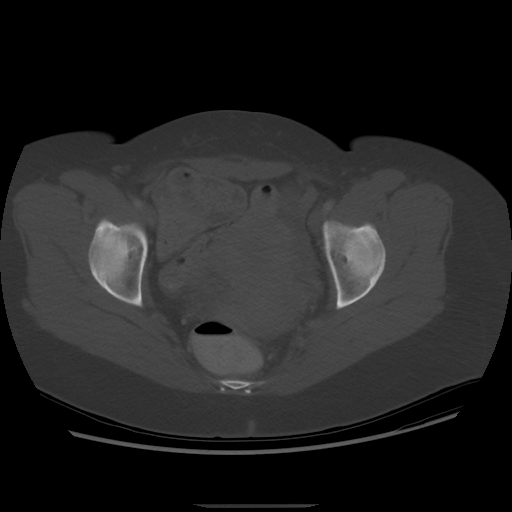
[im 19/82  soft-tissue]
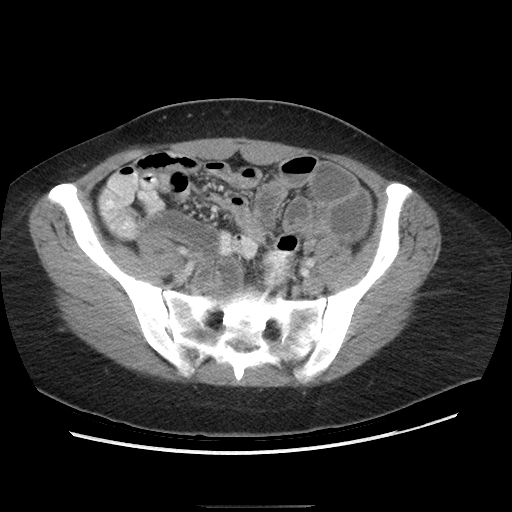
[im 28/82  soft-tissue]
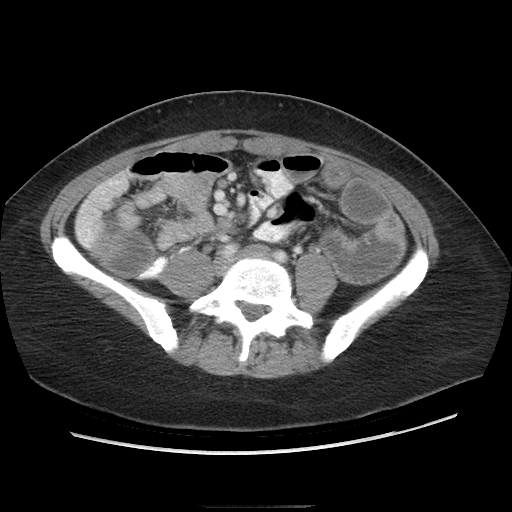
[im 37/82  soft-tissue]
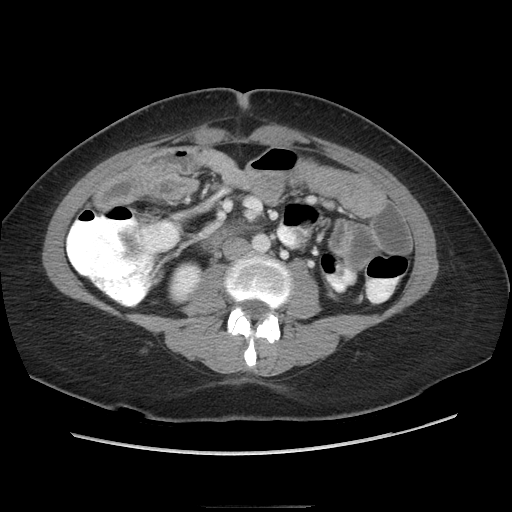
[im 46/82  soft-tissue]
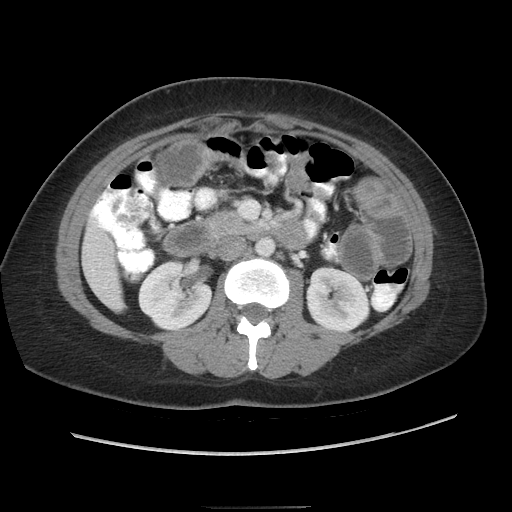
[im 46/82  lung]
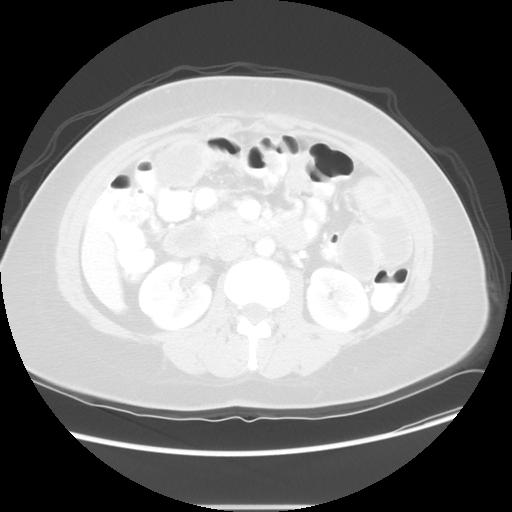
[im 55/82  soft-tissue]
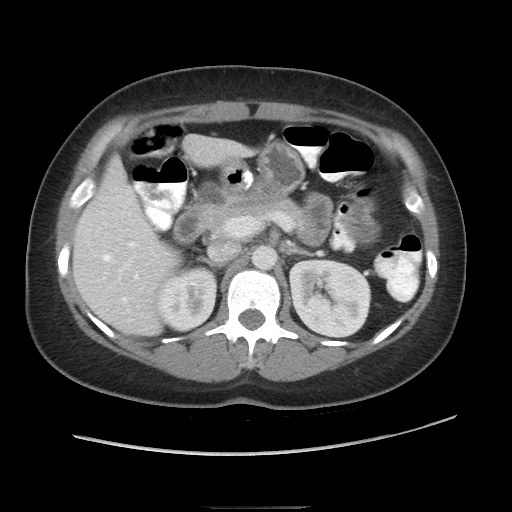
[im 55/82  lung]
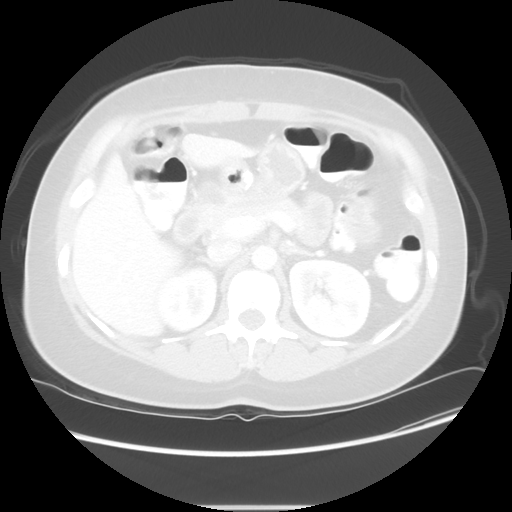
[im 64/82  soft-tissue]
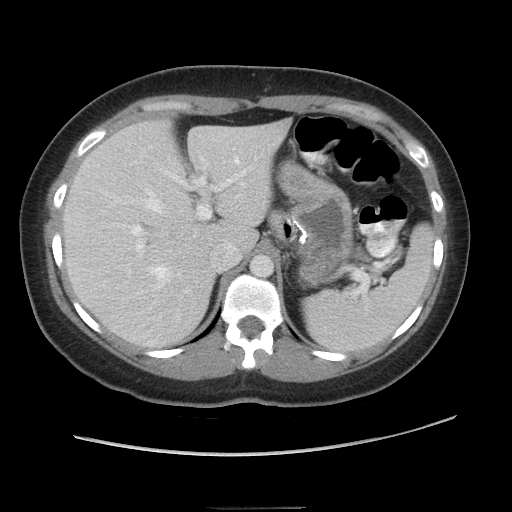
[im 64/82  lung]
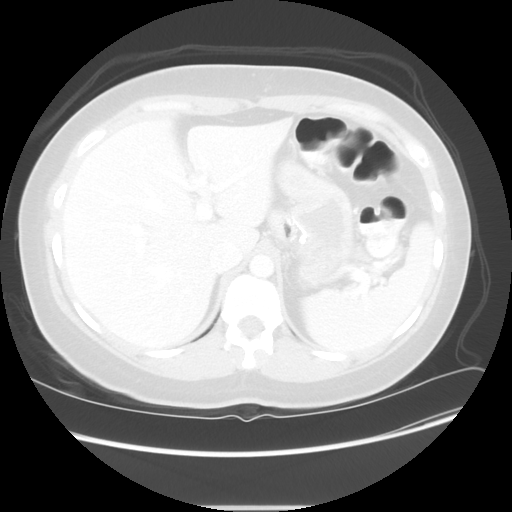
[im 73/82  soft-tissue]
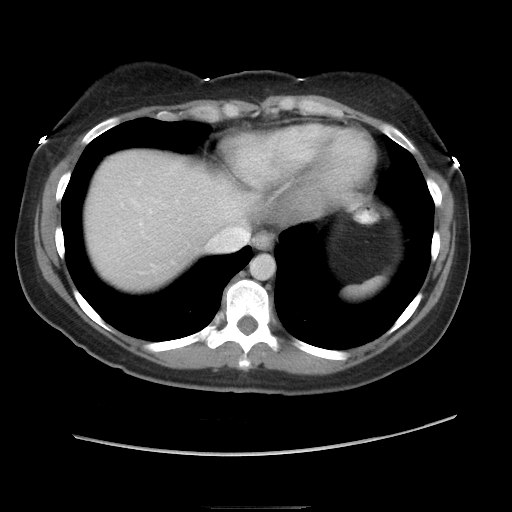
[im 73/82  lung]
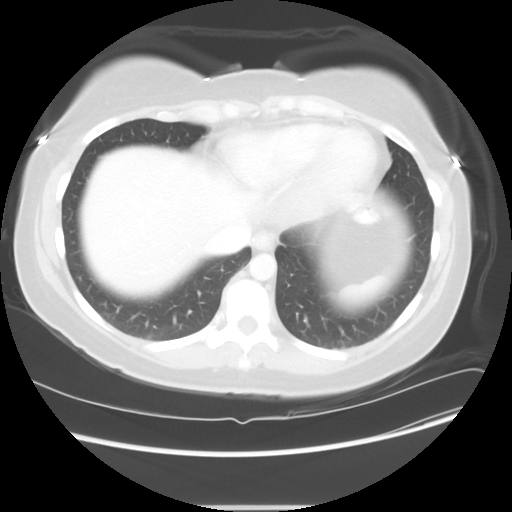

[Series 601: coronal body · coronal · 0.93mm/px · 1 of 126 slices shown, 2 images]
[im 42/126  soft-tissue]
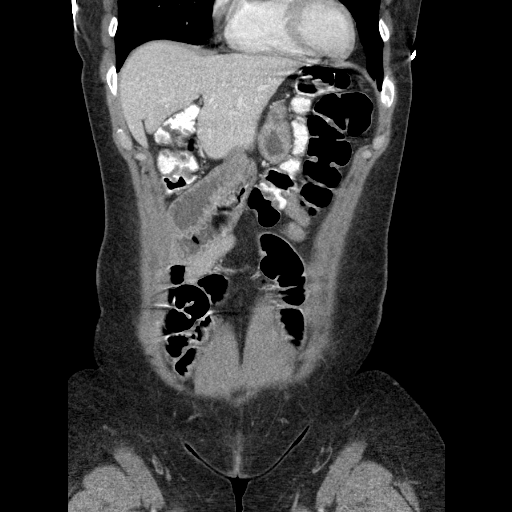
[im 42/126  bone]
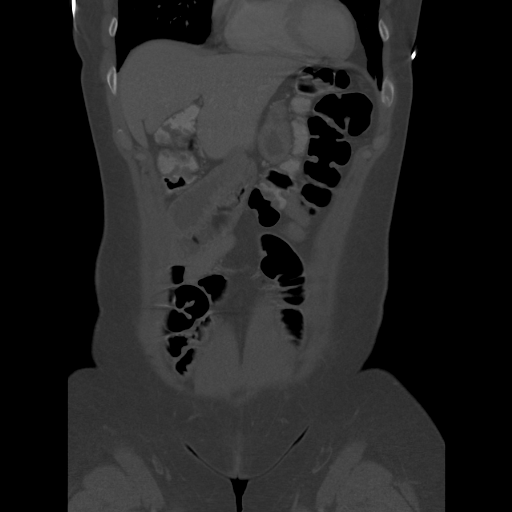

[9 of 36 positions shown; findings below may reference images not displayed]

FINDINGS: On the initial image, there is a 1 cm partially visualized density
in the left lower lobe laterally. Correlation with chest radiography
would be suggested. On the scout view, this looks like a calcified
granuloma.

The liver has a normal appearance without focal lesions or biliary
ductal dilatation. There has been cholecystectomy. Surgical clips
are present at the gastroesophageal region consistent with previous
bypass surgery. The spleen is normal. The pancreas is normal. The
adrenal glands are normal. The kidneys are normal. The aorta and IVC
are normal. Uterus and adnexal regions appear normal.

The gastric bypass anatomy appears somewhat unusual to my
evaluation. The esophagus appears to be completely diverted from the
stomach, instead apparently anastomosed to a small bowel loop in the
right upper abdomen. There are some dilated loops of small intestine
with questionably thick walls. This could represent enteritis. There
is no frank obstruction as the previously mentioned contrast has all
passed into the colon.
IMPRESSION: No evidence of organ pathology.

Previous gastric bypass surgery of some sort of unconventional
anatomy to my evaluation. There is no obstruction, as previously
administered oral contrast has all passed into the colon. There are
some dilated loops of small intestine with questionable thick walls.
This could be due to enteritis or possibly due to a partial small
bowel obstruction. Oral contrast administration study may be useful
for understanding this better.

Probable 1 cm calcified granuloma in the left lower lobe.
Correlation with chest radiography or previous studies suggested.

## 2014-12-27 DIAGNOSIS — M25552 Pain in left hip: Secondary | ICD-10-CM | POA: Insufficient documentation

## 2015-02-15 ENCOUNTER — Telehealth: Payer: Self-pay | Admitting: *Deleted

## 2015-02-15 ENCOUNTER — Other Ambulatory Visit: Payer: Self-pay | Admitting: Hematology and Oncology

## 2015-02-15 ENCOUNTER — Other Ambulatory Visit (HOSPITAL_BASED_OUTPATIENT_CLINIC_OR_DEPARTMENT_OTHER): Payer: BC Managed Care – PPO

## 2015-02-15 ENCOUNTER — Ambulatory Visit (HOSPITAL_BASED_OUTPATIENT_CLINIC_OR_DEPARTMENT_OTHER): Payer: BC Managed Care – PPO | Admitting: Hematology and Oncology

## 2015-02-15 VITALS — BP 130/89 | HR 79 | Temp 98.4°F | Resp 18 | Ht 65.0 in | Wt 188.9 lb

## 2015-02-15 DIAGNOSIS — D509 Iron deficiency anemia, unspecified: Secondary | ICD-10-CM

## 2015-02-15 DIAGNOSIS — E538 Deficiency of other specified B group vitamins: Secondary | ICD-10-CM | POA: Diagnosis not present

## 2015-02-15 LAB — CBC & DIFF AND RETIC
BASO%: 0.7 % (ref 0.0–2.0)
Basophils Absolute: 0 10*3/uL (ref 0.0–0.1)
EOS ABS: 0.1 10*3/uL (ref 0.0–0.5)
EOS%: 1.8 % (ref 0.0–7.0)
HEMATOCRIT: 41 % (ref 34.8–46.6)
HEMOGLOBIN: 14.2 g/dL (ref 11.6–15.9)
Immature Retic Fract: 6.1 % (ref 1.60–10.00)
LYMPH%: 38 % (ref 14.0–49.7)
MCH: 33 pg (ref 25.1–34.0)
MCHC: 34.6 g/dL (ref 31.5–36.0)
MCV: 95.3 fL (ref 79.5–101.0)
MONO#: 0.5 10*3/uL (ref 0.1–0.9)
MONO%: 9.5 % (ref 0.0–14.0)
NEUT#: 2.8 10*3/uL (ref 1.5–6.5)
NEUT%: 50 % (ref 38.4–76.8)
Platelets: 288 10*3/uL (ref 145–400)
RBC: 4.3 10*6/uL (ref 3.70–5.45)
RDW: 12.3 % (ref 11.2–14.5)
RETIC %: 1.69 % (ref 0.70–2.10)
RETIC CT ABS: 72.67 10*3/uL (ref 33.70–90.70)
WBC: 5.5 10*3/uL (ref 3.9–10.3)
lymph#: 2.1 10*3/uL (ref 0.9–3.3)

## 2015-02-15 LAB — VITAMIN B12: Vitamin B-12: 201 pg/mL — ABNORMAL LOW (ref 211–911)

## 2015-02-15 LAB — FERRITIN CHCC: Ferritin: 19 ng/ml (ref 9–269)

## 2015-02-15 NOTE — Telephone Encounter (Signed)
-----   Message from Artis DelayNi Gorsuch, MD sent at 02/15/2015  3:03 PM EDT ----- Regarding: B12 deficiency She has B12 deficiency and low ferritin  Options include B12 inj at least once followed by oral replacement or just oral replacement and recheck in 3 months Iv iron once followed by oral replacement or just oral replacement and recheck in 3 months ----- Message -----    From: [facility]    Sent: 02/15/2015   8:55 AM      To: Artis DelayNi Gorsuch, MD

## 2015-02-15 NOTE — Telephone Encounter (Signed)
Informed pt of lab results and Dr. Maxine GlennGorsuch's message below.  Abigail [name] says Abigail [name] would like to come in for IV Iron and Vitamin B12 injection.  Abigail [name] prefers early in the day while her daughter is in school.

## 2015-02-16 ENCOUNTER — Telehealth: Payer: Self-pay | Admitting: Hematology and Oncology

## 2015-02-16 ENCOUNTER — Other Ambulatory Visit: Payer: Self-pay | Admitting: Hematology and Oncology

## 2015-02-16 NOTE — Telephone Encounter (Signed)
I have placed order and POF for B12 inj and IV iron X 1 I would recommend PCP to recheck in 3 months After infusion next week,  1) Start oral B12 vitamin 1000 mcg daily 2) iron sulfate 1 tab daily at night  If she cannot tolerate oral supplement and repeat labs are still low in 3 months, please call me back

## 2015-02-16 NOTE — Telephone Encounter (Signed)
lvm for pt regarding to 5.20 appt °

## 2015-02-17 NOTE — Progress Notes (Signed)
[facility] OFFICE PROGRESS NOTE  Verlon AuBoyd, Tammy Lamonica, MD SUMMARY OF HEMATOLOGIC HISTORY: Chronic mineral deficiency including vitamin B12 deficiency, iron deficiency in vitamin D deficiency from history of gastric bypass  SUMMARY OF HEMATOLOGIC HISTORY: This is a pleasant 39 year old lady with background history of morbid obesity status post gastric bypass in 2003. She was found to have history of B12 and iron deficiency anemia and had received intravenous iron infusion in the past. In November 2014, she received one more dose of intravenous iron infusion She underwent endometrial ablation since I saw her INTERVAL HISTORY: Abigail [name] 39 y.o. female returns for follow-up. She feels well The patient denies any recent signs or symptoms of bleeding such as spontaneous epistaxis, hematuria or hematochezia. She stopped her mineral supplement several months ago  I have reviewed the past medical history, past surgical history, social history and family history with the patient and they are unchanged from previous note.  ALLERGIES:  has No Known Allergies.  MEDICATIONS:  Current Outpatient Prescriptions  Medication Sig Dispense Refill  . ALPRAZolam (XANAX) 0.5 MG tablet Take 0.5 mg by mouth as needed.     No current facility-administered medications for this visit.     REVIEW OF SYSTEMS:   Constitutional: Denies fevers, chills or night sweats Eyes: Denies blurriness of vision Ears, nose, mouth, throat, and face: Denies mucositis or sore throat Respiratory: Denies cough, dyspnea or wheezes Cardiovascular: Denies palpitation, chest discomfort or lower extremity swelling Gastrointestinal:  Denies nausea, heartburn or change in bowel habits Skin: Denies abnormal skin rashes Lymphatics: Denies new lymphadenopathy or easy bruising Neurological:Denies numbness, tingling or new weaknesses Behavioral/Psych: Mood is stable, no new changes  All other systems were reviewed  with the patient and are negative.  PHYSICAL EXAMINATION: ECOG PERFORMANCE STATUS: 0 - Asymptomatic  Filed Vitals:   02/15/15 0853  BP: 130/89  Pulse: 79  Temp: 98.4 F (36.9 C)  Resp: 18   Filed Weights   02/15/15 0853  Weight: 188 lb 14.4 oz (85.684 kg)    GENERAL:alert, no distress and comfortable SKIN: skin color, texture, turgor are normal, no rashes or significant lesions EYES: normal, Conjunctiva are pink and non-injected, sclera clear OROPHARYNX:no exudate, no erythema and lips, buccal mucosa, and tongue normal  NECK: supple, thyroid normal size, non-tender, without nodularity LYMPH:  no palpable lymphadenopathy in the cervical, axillary or inguinal LUNGS: clear to auscultation and percussion with normal breathing effort HEART: regular rate & rhythm and no murmurs and no lower extremity edema ABDOMEN:abdomen soft, non-tender and normal bowel sounds Musculoskeletal:no cyanosis of digits and no clubbing  NEURO: alert & oriented x 3 with fluent speech, no focal motor/sensory deficits  LABORATORY DATA:  I have reviewed the data as listed No results found for this or any previous visit (from the past 48 hour(s)).  Lab Results  Component Value Date   WBC 5.5 02/15/2015   HGB 14.2 02/15/2015   HCT 41.0 02/15/2015   MCV 95.3 02/15/2015   PLT 288 02/15/2015    RADIOGRAPHIC STUDIES: I have personally reviewed the radiological images as listed and agreed with the findings in the report. No results found.  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 We discussed some of the risks, benefits, and alternatives of intravenous iron infusions. The patient is symptomatic from anemia and the iron level is critically low. She tolerated oral iron supplement poorly and  desires to achieved higher levels of iron faster for adequate hematopoesis. Some of the side-effects to be  expected including risks of infusion reactions, phlebitis, headaches, nausea and fatigue.  The patient is willing to proceed. Patient education material was dispensed.  Goal is to keep ferritin level greater than 50 Recommend recheck ferritin with PCP in 3 months    B12 deficiency She stopped taking B12 supplement several months ago I recommend one time B12 injection and resume high dose oral supplement at 1000 mcg daily and recheck 3 months with PCP    All questions were answered. The patient knows to call the clinic with any problems, questions or concerns. No barriers to learning was detected.  I spent 15 minutes counseling the patient face to face. The total time spent in the appointment was 20 minutes and more than 50% was on counseling.     [facility] FacilityGORSUCH, Zakari Couchman, MD 5/14/20169:57 AM

## 2015-02-17 NOTE — Assessment & Plan Note (Signed)
She stopped taking B12 supplement several months ago I recommend one time B12 injection and resume high dose oral supplement at 1000 mcg daily and recheck 3 months with PCP

## 2015-02-17 NOTE — Assessment & Plan Note (Signed)
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 We discussed some of the risks, benefits, and alternatives of intravenous iron infusions. The patient is symptomatic from anemia and the iron level is critically low. She tolerated oral iron supplement poorly and desires to achieved higher levels of iron faster for adequate hematopoesis. Some of the side-effects to be expected including risks of infusion reactions, phlebitis, headaches, nausea and fatigue.  The patient is willing to proceed. Patient education material was dispensed.  Goal is to keep ferritin level greater than 50 Recommend recheck ferritin with PCP in 3 months

## 2015-02-23 ENCOUNTER — Ambulatory Visit: Payer: BC Managed Care – PPO

## 2015-02-23 ENCOUNTER — Ambulatory Visit (HOSPITAL_BASED_OUTPATIENT_CLINIC_OR_DEPARTMENT_OTHER): Payer: BC Managed Care – PPO

## 2015-02-23 VITALS — BP 113/73 | HR 71 | Temp 97.7°F

## 2015-02-23 DIAGNOSIS — E538 Deficiency of other specified B group vitamins: Secondary | ICD-10-CM

## 2015-02-23 DIAGNOSIS — D509 Iron deficiency anemia, unspecified: Secondary | ICD-10-CM | POA: Diagnosis not present

## 2015-02-23 MED ORDER — CYANOCOBALAMIN 1000 MCG/ML IJ SOLN
1000.0000 ug | Freq: Once | INTRAMUSCULAR | Status: AC
  Administered 2015-02-23: 1000 ug via INTRAMUSCULAR

## 2015-02-23 MED ORDER — SODIUM CHLORIDE 0.9 % IV SOLN
510.0000 mg | Freq: Once | INTRAVENOUS | Status: AC
  Administered 2015-02-23: 510 mg via INTRAVENOUS
  Filled 2015-02-23: qty 17

## 2015-02-23 MED ORDER — CYANOCOBALAMIN 1000 MCG/ML IJ SOLN
INTRAMUSCULAR | Status: AC
  Filled 2015-02-23: qty 1

## 2015-02-23 MED ORDER — SODIUM CHLORIDE 0.9 % IV SOLN
Freq: Once | INTRAVENOUS | Status: AC
  Administered 2015-02-23: 09:00:00 via INTRAVENOUS

## 2015-02-23 NOTE — Patient Instructions (Signed)

## 2015-02-23 NOTE — Progress Notes (Signed)
Vitamin B12 injection given by the infulsion nurse.

## 2015-02-28 ENCOUNTER — Telehealth: Payer: Self-pay | Admitting: *Deleted

## 2015-02-28 NOTE — Telephone Encounter (Signed)
Called pt to give her instructions from Dr. Bertis RuddyGorsuch as follows;  "I would recommend PCP to recheck in 3 months After infusion next week, 1) Start oral B12 vitamin 1000 mcg daily 2) iron sulfate 1 tab daily at night"  Pt verbalized understanding.  She would like to have lab scheduled in our clinic, if ok w/ Dr. Bertis RuddyGorsuch. in 3 months.  She also states her only received 1/2 iron dose last week and not sure if Dr. Bertis RuddyGorsuch intended for her to be scheduled for the "other half" dose?   She was only scheduled for one day.   Informed pt Nurse will call her back next week to let her know.

## 2015-03-05 NOTE — Telephone Encounter (Signed)
Since she was not anemic, I felt 510 mg should be sufficient for now

## 2015-03-07 ENCOUNTER — Telehealth: Payer: Self-pay | Admitting: Hematology and Oncology

## 2015-03-07 ENCOUNTER — Telehealth: Payer: Self-pay | Admitting: *Deleted

## 2015-03-07 NOTE — Telephone Encounter (Signed)
Left VM for pt informing her Dr. Bertis RuddyGorsuch said it is ok for her to get feraheme 510 mg since she was not anemic.   Recheck lab in 3 months.  Please call us back if any questions.

## 2015-03-07 NOTE — Telephone Encounter (Signed)
s.w. pt and advised on Aug appt....pt ok and aware °

## 2015-05-30 ENCOUNTER — Telehealth: Payer: Self-pay | Admitting: Hematology and Oncology

## 2015-05-30 NOTE — Telephone Encounter (Signed)
returned call and s.w. pt and r/s appt ....pt ok and aware of new d.t °

## 2015-06-01 ENCOUNTER — Other Ambulatory Visit: Payer: BC Managed Care – PPO

## 2015-06-08 ENCOUNTER — Telehealth: Payer: Self-pay | Admitting: *Deleted

## 2015-06-08 ENCOUNTER — Other Ambulatory Visit (HOSPITAL_BASED_OUTPATIENT_CLINIC_OR_DEPARTMENT_OTHER): Payer: BC Managed Care – PPO

## 2015-06-08 ENCOUNTER — Other Ambulatory Visit: Payer: Self-pay | Admitting: Hematology and Oncology

## 2015-06-08 ENCOUNTER — Telehealth: Payer: Self-pay | Admitting: Hematology and Oncology

## 2015-06-08 DIAGNOSIS — D509 Iron deficiency anemia, unspecified: Secondary | ICD-10-CM

## 2015-06-08 DIAGNOSIS — E538 Deficiency of other specified B group vitamins: Secondary | ICD-10-CM

## 2015-06-08 LAB — CBC & DIFF AND RETIC
BASO%: 0.9 % (ref 0.0–2.0)
Basophils Absolute: 0.1 10*3/uL (ref 0.0–0.1)
EOS%: 1.5 % (ref 0.0–7.0)
Eosinophils Absolute: 0.1 10*3/uL (ref 0.0–0.5)
HEMATOCRIT: 42.5 % (ref 34.8–46.6)
HGB: 14.7 g/dL (ref 11.6–15.9)
Immature Retic Fract: 4.8 % (ref 1.60–10.00)
LYMPH%: 26.3 % (ref 14.0–49.7)
MCH: 33.7 pg (ref 25.1–34.0)
MCHC: 34.6 g/dL (ref 31.5–36.0)
MCV: 97.5 fL (ref 79.5–101.0)
MONO#: 0.5 10*3/uL (ref 0.1–0.9)
MONO%: 7.9 % (ref 0.0–14.0)
NEUT%: 63.4 % (ref 38.4–76.8)
NEUTROS ABS: 4.3 10*3/uL (ref 1.5–6.5)
Platelets: 256 10*3/uL (ref 145–400)
RBC: 4.36 10*6/uL (ref 3.70–5.45)
RDW: 12.3 % (ref 11.2–14.5)
RETIC %: 1.9 % (ref 0.70–2.10)
RETIC CT ABS: 82.84 10*3/uL (ref 33.70–90.70)
WBC: 6.8 10*3/uL (ref 3.9–10.3)
lymph#: 1.8 10*3/uL (ref 0.9–3.3)

## 2015-06-08 LAB — IRON AND TIBC CHCC
%SAT: 23 % (ref 21–57)
IRON: 76 ug/dL (ref 41–142)
TIBC: 331 ug/dL (ref 236–444)
UIBC: 255 ug/dL (ref 120–384)

## 2015-06-08 LAB — VITAMIN B12: VITAMIN B 12: 296 pg/mL (ref 211–911)

## 2015-06-08 LAB — FERRITIN CHCC: FERRITIN: 33 ng/mL (ref 9–269)

## 2015-06-08 NOTE — Telephone Encounter (Signed)
LVM for pt informing her of Dr. Maxine Glenn message below.  Asked her to call nurse if any questions.

## 2015-06-08 NOTE — Telephone Encounter (Signed)
-----   Message from Artis Delay, MD sent at 06/08/2015  1:42 PM EDT ----- Regarding: test results All tests are better. Continue high dose B12 PO I placed order for labs recheck in 3 months ----- Message -----    From: [facility]    Sent: 06/08/2015   9:26 AM      To: Artis Delay, MD

## 2015-06-08 NOTE — Telephone Encounter (Signed)
lvm for pt regarding to Dec appt.... °

## 2015-08-24 ENCOUNTER — Other Ambulatory Visit: Payer: Self-pay | Admitting: Hematology and Oncology

## 2015-09-06 ENCOUNTER — Telehealth: Payer: Self-pay | Admitting: Hematology and Oncology

## 2015-09-06 NOTE — Telephone Encounter (Signed)
returned call and lvm for pt regarding calling back to r/s

## 2015-09-07 ENCOUNTER — Other Ambulatory Visit: Payer: BC Managed Care – PPO

## 2016-06-24 LAB — LAB REPORT - SCANNED: PAP SMEAR: NEGATIVE

## 2018-02-05 DIAGNOSIS — R41840 Attention and concentration deficit: Secondary | ICD-10-CM | POA: Insufficient documentation

## 2018-02-05 DIAGNOSIS — F411 Generalized anxiety disorder: Secondary | ICD-10-CM | POA: Insufficient documentation

## 2020-06-08 DIAGNOSIS — Z8639 Personal history of other endocrine, nutritional and metabolic disease: Secondary | ICD-10-CM | POA: Insufficient documentation

## 2020-06-08 DIAGNOSIS — R42 Dizziness and giddiness: Secondary | ICD-10-CM | POA: Insufficient documentation

## 2020-06-08 DIAGNOSIS — D649 Anemia, unspecified: Secondary | ICD-10-CM | POA: Insufficient documentation

## 2020-06-18 ENCOUNTER — Telehealth: Payer: Self-pay | Admitting: Hematology and Oncology

## 2020-06-18 NOTE — Telephone Encounter (Signed)
Received a new hem referral from Dr. Leavy Cella for iron deficiency. Abigail [name] returned my call and has been scheduled to see Dr. Bertis Ruddy on 9/27 at 1pm. Pt aware to arrive 30 minutes early.

## 2020-06-29 NOTE — Assessment & Plan Note (Addendum)
The patient will need long-term intravenous iron infusion  The most likely cause of her anemia is due to chronic blood loss/malabsorption syndrome. We discussed some of the risks, benefits, and alternatives of intravenous iron infusions. The patient is symptomatic from anemia and the iron level is critically low. She tolerated oral iron supplement poorly and desires to achieved higher levels of iron faster for adequate hematopoesis. Some of the side-effects to be expected including risks of infusion reactions, phlebitis, headaches, nausea and fatigue.  The patient is willing to proceed. Patient education material was dispensed.  Goal is to keep ferritin level greater than 50 and resolution of anemia I recommend she stop oral iron supplement because it is not likely she will absorb them and it is causing severe constipation  I will schedule IV iron weekly x2 I plan to see her back in 6 weeks after second dose for further follow-up

## 2020-06-29 NOTE — Progress Notes (Signed)
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 female was lost to follow-up I saw her many years ago for similar reasons She is billed as a new patient today I reviewed the patient's records extensive and collaborated the history with the patient. Summary of her history is as follows: She has with background history of morbid obesity status post gastric bypass in 2003. She was found to have history of B12 and iron deficiency anemia and had received intravenous iron infusion in the past. In November 2014, she received one more dose of intravenous iron infusion She underwent endometrial ablation and with less bleeding Last appointment here was around 2016 and she was lost to follow-up I have reviewed her electronic records extensively Early this year, she had bowel obstruction requiring laparoscopic revision to open bypass surgery Since then, she have lost more weight When I saw her last, she weighed close 290 pounds Today, she weighed only 144 pounds  She had multiple labs drawn early September which confirm iron deficiency with mild anemia.  B12 level was adequate She has severe vitamin D deficiency but is not taking supplement  The patient is pleased with her current weight She felt that she have stopped losing weight and is actually gaining some healthy weight She is taking oral iron twice a day causing severe constipation She is still taking oral B12 supplement The patient denies any recent signs or symptoms of bleeding such as spontaneous epistaxis, hematuria or hematochezia. She has minimum menstrual flow since endometrial ablation  Recently, she started to have excessive craving for ice She broke 2  teeth from chewing on ice She complained of excessive fatigue with occasional dizziness  MEDICAL HISTORY:  Past Medical History:  Diagnosis Date  . Anxiety   . B12 deficiency 08/12/2013   History  . Fatigue 08/11/2013  . Genital herpes   . H/O gastric bypass 08/12/2013  . Iron deficiency anemia    last IV iron in 2007; with oral supplement; since  last week; Rx iron  . Ischial bursitis    history   . PONV (postoperative nausea and vomiting)   . Seborrheic keratosis   . SVD (spontaneous vaginal delivery)    x 2    SURGICAL HISTORY: Past Surgical History:  Procedure Laterality Date  . ABDOMINOPLASTY  2004  . BREAST ENHANCEMENT SURGERY  2004  . CHOLECYSTECTOMY  1997  . DILITATION & CURRETTAGE/HYSTROSCOPY WITH HYDROTHERMAL ABLATION N/A 08/09/2014   Procedure: DILATATION & CURETTAGE/HYSTEROSCOPY WITH HYDROTHERMAL ABLATION;  Surgeon: Kathreen Cosier, MD;  Location: [facility];  Service: Gynecology;  Laterality: N/A;  . GASTRIC BYPASS  2003   mini  . TUBAL LIGATION Bilateral 2006  . WISDOM TOOTH EXTRACTION      SOCIAL HISTORY: Social History   Socioeconomic History  . Marital status: Legally Separated    Spouse name: Not on file  . Number of children: 2  . Years of education: Not on file  . Highest education level: Not on file  Occupational History    Employer: [employer]    Comment: graphic design  Tobacco Use  . Smoking status: Never Smoker  . Smokeless tobacco: Never Used  Substance and Sexual Activity  . Alcohol use: Yes  Comment: twice monthly  . Drug use: No  . Sexual activity: Yes    Birth control/protection: Surgical  Other Topics Concern  . Not on file  Social History Narrative  . Not on file   Social Determinants of Health   Financial Resource Strain:   . Difficulty of Paying Living Expenses: Not on file  Food Insecurity:   . Worried About Programme researcher, broadcasting/film/video in the Last Year: Not on file  . Ran Out of Food in the Last Year: Not on file   Transportation Needs:   . Lack of Transportation (Medical): Not on file  . Lack of Transportation (Non-Medical): Not on file  Physical Activity:   . Days of Exercise per Week: Not on file  . Minutes of Exercise per Session: Not on file  Stress:   . Feeling of Stress : Not on file  Social Connections:   . Frequency of Communication with Friends and Family: Not on file  . Frequency of Social Gatherings with Friends and Family: Not on file  . Attends Religious Services: Not on file  . Active Member of Clubs or Organizations: Not on file  . Attends Banker Meetings: Not on file  . Marital Status: Not on file  Intimate Partner Violence:   . Fear of Current or Ex-Partner: Not on file  . Emotionally Abused: Not on file  . Physically Abused: Not on file  . Sexually Abused: Not on file    FAMILY HISTORY: Family History  Problem Relation Age of Onset  . Diabetes Father   . Heart failure Father   . Colon cancer Maternal Grandfather   . Colon polyps Unknown        uncle  . Colon polyps Mother   . Colon polyps Maternal Grandmother        colostomy, daily lax use    ALLERGIES:  has No Known Allergies.  MEDICATIONS:  Current Outpatient Medications  Medication Sig Dispense Refill  . amphetamine-dextroamphetamine (ADDERALL) 20 MG tablet Take 20 mg by mouth in the morning and at bedtime.    . cholecalciferol (VITAMIN D3) 25 MCG (1000 UNIT) tablet Take 5,000 Units by mouth daily.    . vitamin B-12 (CYANOCOBALAMIN) 1000 MCG tablet Take 1,000 mcg by mouth daily.    Marland Kitchen ALPRAZolam (XANAX) 0.5 MG tablet Take 0.5 mg by mouth as needed.     No current facility-administered medications for this visit.    REVIEW OF SYSTEMS:   Constitutional: Denies fevers, chills or abnormal night sweats Eyes: Denies blurriness of vision, double vision or watery eyes Ears, nose, mouth, throat, and face: Denies mucositis or sore throat Respiratory: Denies cough, dyspnea or  wheezes Cardiovascular: Denies palpitation, chest discomfort or lower extremity swelling Gastrointestinal:  Denies nausea, heartburn or change in bowel habits Skin: Denies abnormal skin rashes Lymphatics: Denies new lymphadenopathy or easy bruising Neurological:Denies numbness, tingling or new weaknesses Behavioral/Psych: Mood is stable, no new changes  All other systems were reviewed with the patient and are negative.  PHYSICAL EXAMINATION: ECOG PERFORMANCE STATUS: 1 - Symptomatic but completely ambulatory  Vitals:   07/02/20 1322  BP: 106/64  Pulse: 84  Resp: 18  Temp: 98.8 F (37.1 C)  SpO2: 100%   Filed Weights   07/02/20 1322  Weight: 143 lb 12.8 oz (65.2 kg)    GENERAL:alert, no distress and comfortable SKIN: skin color, texture, turgor are normal, no rashes or significant lesions EYES: normal, conjunctiva are pink and non-injected, sclera clear OROPHARYNX:no exudate,  normal lips, buccal mucosa, and tongue  NECK: supple, thyroid normal size, non-tender, without nodularity LYMPH:  no palpable lymphadenopathy in the cervical, axillary or inguinal LUNGS: clear to auscultation and percussion with normal breathing effort HEART: regular rate & rhythm and no murmurs without lower extremity edema ABDOMEN:abdomen soft, non-tender and normal bowel sounds.  Noted well-healed surgical scars Musculoskeletal:no cyanosis of digits and no clubbing  PSYCH: alert & oriented x 3 with fluent speech NEURO: no focal motor/sensory deficits  LABORATORY DATA:  I have reviewed the data as listed Lab Results  Component Value Date   WBC 6.8 06/08/2015   HGB 14.7 06/08/2015   HCT 42.5 06/08/2015   MCV 97.5 06/08/2015   PLT 256 06/08/2015   No results for input(s): NA, K, CL, CO2, GLUCOSE, BUN, CREATININE, CALCIUM, GFRNONAA, GFRAA, PROT, ALBUMIN, AST, ALT, ALKPHOS, BILITOT, BILIDIR, IBILI in the last 8760 hours.  ASSESSMENT & PLAN:  Iron deficiency anemia The patient will need long-term  intravenous iron infusion  The most likely cause of her anemia is due to chronic blood loss/malabsorption syndrome. We discussed some of the risks, benefits, and alternatives of intravenous iron infusions. The patient is symptomatic from anemia and the iron level is critically low. She tolerated oral iron supplement poorly and desires to achieved higher levels of iron faster for adequate hematopoesis. Some of the side-effects to be expected including risks of infusion reactions, phlebitis, headaches, nausea and fatigue.  The patient is willing to proceed. Patient education material was dispensed.  Goal is to keep ferritin level greater than 50 and resolution of anemia I recommend she stop oral iron supplement because it is not likely she will absorb them and it is causing severe constipation  I will schedule IV iron weekly x2 I plan to see her back in 6 weeks after second dose for further follow-up   B12 deficiency She is at risk of malabsorption secondary to gastric bypass I recommend chronic B12 injections every 3 months indefinitely  Vitamin D deficiency She has severe vitamin D deficiency I recommend high-dose oral vitamin D supplement daily   Orders Placed This Encounter  Procedures  . Ferritin    Standing Status:   Standing    Number of Occurrences:   3    Standing Expiration Date:   06/29/2021  . Iron and TIBC    Standing Status:   Standing    Number of Occurrences:   3    Standing Expiration Date:   06/29/2021  . CBC with Differential/Platelet    Standing Status:   Standing    Number of Occurrences:   22    Standing Expiration Date:   06/29/2021  . VITAMIN D 25 Hydroxy (Vit-D Deficiency, Fractures)    Standing Status:   Standing    Number of Occurrences:   3    Standing Expiration Date:   07/02/2021    All questions were answered. The patient knows to call the clinic with any problems, questions or concerns. The total time spent in the appointment was 55 minutes encounter  with patients including review of chart and various tests results, discussions about plan of care and coordination of care plan   Artis Delay, MD 07/02/2020 2:45 PM

## 2020-06-29 NOTE — Assessment & Plan Note (Signed)
She is at risk of malabsorption secondary to gastric bypass I recommend chronic B12 injections every 3 months indefinitely

## 2020-07-02 ENCOUNTER — Encounter: Payer: Self-pay | Admitting: Hematology and Oncology

## 2020-07-02 ENCOUNTER — Inpatient Hospital Stay: Payer: BC Managed Care – PPO | Attending: Hematology and Oncology | Admitting: Hematology and Oncology

## 2020-07-02 ENCOUNTER — Other Ambulatory Visit: Payer: Self-pay

## 2020-07-02 DIAGNOSIS — E559 Vitamin D deficiency, unspecified: Secondary | ICD-10-CM | POA: Diagnosis not present

## 2020-07-02 DIAGNOSIS — R5383 Other fatigue: Secondary | ICD-10-CM | POA: Diagnosis not present

## 2020-07-02 DIAGNOSIS — Z800 Family history of malignant neoplasm of digestive organs: Secondary | ICD-10-CM | POA: Insufficient documentation

## 2020-07-02 DIAGNOSIS — Z8371 Family history of colonic polyps: Secondary | ICD-10-CM | POA: Insufficient documentation

## 2020-07-02 DIAGNOSIS — K912 Postsurgical malabsorption, not elsewhere classified: Secondary | ICD-10-CM | POA: Insufficient documentation

## 2020-07-02 DIAGNOSIS — R42 Dizziness and giddiness: Secondary | ICD-10-CM | POA: Insufficient documentation

## 2020-07-02 DIAGNOSIS — Z9884 Bariatric surgery status: Secondary | ICD-10-CM | POA: Diagnosis present

## 2020-07-02 DIAGNOSIS — D509 Iron deficiency anemia, unspecified: Secondary | ICD-10-CM

## 2020-07-02 DIAGNOSIS — D500 Iron deficiency anemia secondary to blood loss (chronic): Secondary | ICD-10-CM | POA: Diagnosis not present

## 2020-07-02 DIAGNOSIS — E538 Deficiency of other specified B group vitamins: Secondary | ICD-10-CM

## 2020-07-02 NOTE — Assessment & Plan Note (Signed)
She has severe vitamin D deficiency I recommend high-dose oral vitamin D supplement daily

## 2020-07-04 ENCOUNTER — Other Ambulatory Visit: Payer: Self-pay | Admitting: Hematology and Oncology

## 2020-07-06 ENCOUNTER — Inpatient Hospital Stay: Payer: BC Managed Care – PPO | Attending: Hematology and Oncology

## 2020-07-06 ENCOUNTER — Other Ambulatory Visit: Payer: Self-pay

## 2020-07-06 VITALS — BP 109/65 | HR 73 | Temp 98.2°F | Resp 20

## 2020-07-06 DIAGNOSIS — D500 Iron deficiency anemia secondary to blood loss (chronic): Secondary | ICD-10-CM | POA: Insufficient documentation

## 2020-07-06 DIAGNOSIS — D509 Iron deficiency anemia, unspecified: Secondary | ICD-10-CM

## 2020-07-06 DIAGNOSIS — E559 Vitamin D deficiency, unspecified: Secondary | ICD-10-CM | POA: Insufficient documentation

## 2020-07-06 DIAGNOSIS — Z9884 Bariatric surgery status: Secondary | ICD-10-CM | POA: Diagnosis present

## 2020-07-06 DIAGNOSIS — R5383 Other fatigue: Secondary | ICD-10-CM | POA: Insufficient documentation

## 2020-07-06 DIAGNOSIS — K912 Postsurgical malabsorption, not elsewhere classified: Secondary | ICD-10-CM | POA: Insufficient documentation

## 2020-07-06 DIAGNOSIS — E538 Deficiency of other specified B group vitamins: Secondary | ICD-10-CM | POA: Insufficient documentation

## 2020-07-06 DIAGNOSIS — Z8371 Family history of colonic polyps: Secondary | ICD-10-CM | POA: Diagnosis not present

## 2020-07-06 DIAGNOSIS — R42 Dizziness and giddiness: Secondary | ICD-10-CM | POA: Diagnosis not present

## 2020-07-06 DIAGNOSIS — Z800 Family history of malignant neoplasm of digestive organs: Secondary | ICD-10-CM | POA: Diagnosis not present

## 2020-07-06 MED ORDER — SODIUM CHLORIDE 0.9 % IV SOLN
400.0000 mg | Freq: Once | INTRAVENOUS | Status: AC
  Administered 2020-07-06: 400 mg via INTRAVENOUS
  Filled 2020-07-06: qty 20

## 2020-07-06 MED ORDER — SODIUM CHLORIDE 0.9 % IV SOLN
Freq: Once | INTRAVENOUS | Status: AC
  Filled 2020-07-06: qty 250

## 2020-07-06 NOTE — Patient Instructions (Signed)

## 2020-07-13 ENCOUNTER — Inpatient Hospital Stay: Payer: BC Managed Care – PPO

## 2020-07-13 ENCOUNTER — Other Ambulatory Visit: Payer: Self-pay

## 2020-07-13 VITALS — BP 135/72 | HR 66 | Temp 98.5°F | Resp 18

## 2020-07-13 DIAGNOSIS — D500 Iron deficiency anemia secondary to blood loss (chronic): Secondary | ICD-10-CM | POA: Diagnosis not present

## 2020-07-13 DIAGNOSIS — D509 Iron deficiency anemia, unspecified: Secondary | ICD-10-CM

## 2020-07-13 MED ORDER — SODIUM CHLORIDE 0.9 % IV SOLN
Freq: Once | INTRAVENOUS | Status: AC
  Filled 2020-07-13: qty 250

## 2020-07-13 MED ORDER — SODIUM CHLORIDE 0.9 % IV SOLN
400.0000 mg | Freq: Once | INTRAVENOUS | Status: AC
  Administered 2020-07-13: 400 mg via INTRAVENOUS
  Filled 2020-07-13: qty 20

## 2020-08-24 ENCOUNTER — Other Ambulatory Visit: Payer: Self-pay

## 2020-08-24 ENCOUNTER — Inpatient Hospital Stay: Payer: BC Managed Care – PPO | Attending: Hematology and Oncology | Admitting: Hematology and Oncology

## 2020-08-24 ENCOUNTER — Encounter: Payer: Self-pay | Admitting: Hematology and Oncology

## 2020-08-24 ENCOUNTER — Inpatient Hospital Stay: Payer: BC Managed Care – PPO

## 2020-08-24 DIAGNOSIS — E559 Vitamin D deficiency, unspecified: Secondary | ICD-10-CM

## 2020-08-24 DIAGNOSIS — E538 Deficiency of other specified B group vitamins: Secondary | ICD-10-CM

## 2020-08-24 DIAGNOSIS — Z800 Family history of malignant neoplasm of digestive organs: Secondary | ICD-10-CM | POA: Diagnosis not present

## 2020-08-24 DIAGNOSIS — Z87738 Personal history of other specified (corrected) congenital malformations of digestive system: Secondary | ICD-10-CM | POA: Diagnosis not present

## 2020-08-24 DIAGNOSIS — R5383 Other fatigue: Secondary | ICD-10-CM | POA: Diagnosis not present

## 2020-08-24 DIAGNOSIS — D500 Iron deficiency anemia secondary to blood loss (chronic): Secondary | ICD-10-CM | POA: Insufficient documentation

## 2020-08-24 DIAGNOSIS — D509 Iron deficiency anemia, unspecified: Secondary | ICD-10-CM

## 2020-08-24 DIAGNOSIS — R42 Dizziness and giddiness: Secondary | ICD-10-CM | POA: Insufficient documentation

## 2020-08-24 DIAGNOSIS — Z9884 Bariatric surgery status: Secondary | ICD-10-CM | POA: Diagnosis present

## 2020-08-24 DIAGNOSIS — Z8371 Family history of colonic polyps: Secondary | ICD-10-CM | POA: Diagnosis not present

## 2020-08-24 LAB — CBC WITH DIFFERENTIAL/PLATELET
Abs Immature Granulocytes: 0.01 10*3/uL (ref 0.00–0.07)
Basophils Absolute: 0.1 10*3/uL (ref 0.0–0.1)
Basophils Relative: 1 %
Eosinophils Absolute: 0.2 10*3/uL (ref 0.0–0.5)
Eosinophils Relative: 3 %
HCT: 40.1 % (ref 36.0–46.0)
Hemoglobin: 13.1 g/dL (ref 12.0–15.0)
Immature Granulocytes: 0 %
Lymphocytes Relative: 22 %
Lymphs Abs: 1.7 10*3/uL (ref 0.7–4.0)
MCH: 28 pg (ref 26.0–34.0)
MCHC: 32.7 g/dL (ref 30.0–36.0)
MCV: 85.7 fL (ref 80.0–100.0)
Monocytes Absolute: 0.6 10*3/uL (ref 0.1–1.0)
Monocytes Relative: 8 %
Neutro Abs: 5.2 10*3/uL (ref 1.7–7.7)
Neutrophils Relative %: 66 %
Platelets: 300 10*3/uL (ref 150–400)
RBC: 4.68 MIL/uL (ref 3.87–5.11)
RDW: 18.7 % — ABNORMAL HIGH (ref 11.5–15.5)
WBC: 7.7 10*3/uL (ref 4.0–10.5)
nRBC: 0 % (ref 0.0–0.2)

## 2020-08-24 LAB — FERRITIN: Ferritin: 42 ng/mL (ref 11–307)

## 2020-08-24 LAB — VITAMIN D 25 HYDROXY (VIT D DEFICIENCY, FRACTURES): Vit D, 25-Hydroxy: 12.06 ng/mL — ABNORMAL LOW (ref 30–100)

## 2020-08-24 LAB — IRON AND TIBC
Iron: 55 ug/dL (ref 41–142)
Saturation Ratios: 16 % — ABNORMAL LOW (ref 21–57)
TIBC: 343 ug/dL (ref 236–444)
UIBC: 287 ug/dL (ref 120–384)

## 2020-08-24 NOTE — Assessment & Plan Note (Signed)
Vitamin D level is pending We will call her with test results if the dose of vitamin D needs to be adjusted

## 2020-08-24 NOTE — Assessment & Plan Note (Addendum)
While her anemia is better, she is still borderline iron deficiency She is symptomatic I recommend we proceed with 2 more doses of IV iron over the next few weeks The most likely cause of her anemia is due to chronic blood loss/malabsorption syndrome. We discussed some of the risks, benefits, and alternatives of intravenous iron infusions. The patient is symptomatic from anemia and the iron level is critically low. She tolerated oral iron supplement poorly and desires to achieved higher levels of iron faster for adequate hematopoesis. Some of the side-effects to be expected including risks of infusion reactions, phlebitis, headaches, nausea and fatigue.  The patient is willing to proceed. Patient education material was dispensed.  Goal is to keep ferritin level greater than 50 and resolution of anemia  I will see her in 6 months for further follow-up

## 2020-08-24 NOTE — Progress Notes (Signed)
[facility] OFFICE PROGRESS NOTE  Verlon Au, MD  ASSESSMENT & PLAN:  Iron deficiency anemia While her anemia is better, she is still borderline iron deficiency She is symptomatic I recommend we proceed with 2 more doses of IV iron over the next few weeks The most likely cause of her anemia is due to chronic blood loss/malabsorption syndrome. We discussed some of the risks, benefits, and alternatives of intravenous iron infusions. The patient is symptomatic from anemia and the iron level is critically low. She tolerated oral iron supplement poorly and desires to achieved higher levels of iron faster for adequate hematopoesis. Some of the side-effects to be expected including risks of infusion reactions, phlebitis, headaches, nausea and fatigue.  The patient is willing to proceed. Patient education material was dispensed.  Goal is to keep ferritin level greater than 50 and resolution of anemia  I will see her in 6 months for further follow-up  B12 deficiency Last B12 level was okay I plan to check B12 level in her next visit  Vitamin D deficiency Vitamin D level is pending We will call her with test results if the dose of vitamin D needs to be adjusted   Orders Placed This Encounter  Procedures  . Vitamin B12    Standing Status:   Standing    Number of Occurrences:   2    Standing Expiration Date:   08/24/2021    The total time spent in the appointment was 20 minutes encounter with patients including review of chart and various tests results, discussions about plan of care and coordination of care plan   All questions were answered. The patient knows to call the clinic with any problems, questions or concerns. No barriers to learning was detected.    Artis Delay, MD 11/19/20211:55 PM  INTERVAL HISTORY: Abigail Abigail [name] 44 y.o. Abigail [name] returns for further follow-up for multiple mineral deficiency secondary to bypass surgery and malabsorption Since last  time I saw her, she felt better Her ice craving has resolved She still have some mild fatigue The patient denies any recent signs or symptoms of bleeding such as spontaneous epistaxis, hematuria or hematochezia.   SUMMARY OF HEMATOLOGIC HISTORY: Abigail Abigail [name] 44 y.o. Abigail [name] was lost to follow-up I saw her many years ago for similar reasons She is billed as a new patient today I reviewed the patient's records extensive and collaborated the history with the patient. Summary of her history is as follows: She has with background history of morbid obesity status post gastric bypass in 2003. She was found to have history of B12 and iron deficiency anemia and had received intravenous iron infusion in the past. In November 2014, she received one more dose of intravenous iron infusion She underwent endometrial ablation and with less bleeding Last appointment here was around 2016 and she was lost to follow-up I have reviewed her electronic records extensively Early this year, she had bowel obstruction requiring laparoscopic revision to open bypass surgery Since then, she have lost more weight When I saw her last, she weighed close 290 pounds Today, she weighed only 144 pounds  She had multiple labs drawn early September which confirm iron deficiency with mild anemia.  B12 level was adequate She has severe vitamin D deficiency but is not taking supplement  The patient is pleased with her current weight She felt that she have stopped losing weight and is actually gaining some healthy weight She is taking oral iron twice a day causing  severe constipation She is still taking oral B12 supplement The patient denies any recent signs or symptoms of bleeding such as spontaneous epistaxis, hematuria or hematochezia. She has minimum menstrual flow since endometrial ablation  Recently, she started to have excessive craving for ice She broke 2 teeth from chewing on ice She complained of excessive  fatigue with occasional dizziness She received iron sucrose in October  I have reviewed the past medical history, past surgical history, social history and family history with the patient and they are unchanged from previous note.  ALLERGIES:  has No Known Allergies.  MEDICATIONS:  Current Outpatient Medications  Medication Sig Dispense Refill  . ALPRAZolam (XANAX) 0.5 MG tablet Take 0.5 mg by mouth as needed.    Marland Kitchen amphetamine-dextroamphetamine (ADDERALL) 20 MG tablet Take 20 mg by mouth in the morning and at bedtime.    . cholecalciferol (VITAMIN D3) 25 MCG (1000 UNIT) tablet Take 5,000 Units by mouth daily.    . vitamin B-12 (CYANOCOBALAMIN) 1000 MCG tablet Take 1,000 mcg by mouth daily.     No current facility-administered medications for this visit.     REVIEW OF SYSTEMS:   Constitutional: Denies fevers, chills or night sweats Eyes: Denies blurriness of vision Ears, nose, mouth, throat, and face: Denies mucositis or sore throat Respiratory: Denies cough, dyspnea or wheezes Cardiovascular: Denies palpitation, chest discomfort or lower extremity swelling Gastrointestinal:  Denies nausea, heartburn or change in bowel habits Skin: Denies abnormal skin rashes Lymphatics: Denies new lymphadenopathy or easy bruising Neurological:Denies numbness, tingling or new weaknesses Behavioral/Psych: Mood is stable, no new changes  All other systems were reviewed with the patient and are negative.  PHYSICAL EXAMINATION: ECOG PERFORMANCE STATUS: 0 - Asymptomatic  Vitals:   08/24/20 0953  BP: 117/79  Pulse: 80  Resp: 18  Temp: 98.3 F (36.8 C)  SpO2: 100%   Filed Weights   08/24/20 0953  Weight: 144 lb 3.2 oz (65.4 kg)    GENERAL:alert, no distress and comfortable NEURO: alert & oriented x 3 with fluent speech, no focal motor/sensory deficits  LABORATORY DATA:  I have reviewed the data as listed     Component Value Date/Time   NA 139 09/29/2013 1609   NA 140 09/23/2012 1052    K 3.6 09/29/2013 1609   K 4.5 09/23/2012 1052   CL 101 09/29/2013 1609   CL 106 09/23/2012 1052   CO2 25 09/29/2013 1609   CO2 28 09/23/2012 1052   GLUCOSE 94 09/29/2013 1609   GLUCOSE 95 09/23/2012 1052   BUN 8 09/29/2013 1609   BUN 9.0 09/23/2012 1052   CREATININE 0.74 09/29/2013 1609   CREATININE 0.7 09/23/2012 1052   CALCIUM 9.9 09/29/2013 1609   CALCIUM 9.0 09/23/2012 1052   PROT 7.4 09/29/2013 1609   PROT 6.8 09/23/2012 1052   ALBUMIN 4.9 09/29/2013 1609   ALBUMIN 3.4 (L) 09/23/2012 1052   AST 18 09/29/2013 1609   AST 26 09/23/2012 1052   ALT 23 09/29/2013 1609   ALT 21 09/23/2012 1052   ALKPHOS 81 09/29/2013 1609   ALKPHOS 101 09/23/2012 1052   BILITOT 0.6 09/29/2013 1609   BILITOT 0.37 09/23/2012 1052   GFRNONAA >89 09/29/2013 1609   GFRAA >89 09/29/2013 1609    No results found for: SPEP, UPEP  Lab Results  Component Value Date   WBC 7.7 08/24/2020   NEUTROABS 5.2 08/24/2020   HGB 13.1 08/24/2020   HCT 40.1 08/24/2020   MCV 85.7 08/24/2020   PLT 300 08/24/2020  Chemistry      Component Value Date/Time   NA 139 09/29/2013 1609   NA 140 09/23/2012 1052   K 3.6 09/29/2013 1609   K 4.5 09/23/2012 1052   CL 101 09/29/2013 1609   CL 106 09/23/2012 1052   CO2 25 09/29/2013 1609   CO2 28 09/23/2012 1052   BUN 8 09/29/2013 1609   BUN 9.0 09/23/2012 1052   CREATININE 0.74 09/29/2013 1609   CREATININE 0.7 09/23/2012 1052      Component Value Date/Time   CALCIUM 9.9 09/29/2013 1609   CALCIUM 9.0 09/23/2012 1052   ALKPHOS 81 09/29/2013 1609   ALKPHOS 101 09/23/2012 1052   AST 18 09/29/2013 1609   AST 26 09/23/2012 1052   ALT 23 09/29/2013 1609   ALT 21 09/23/2012 1052   BILITOT 0.6 09/29/2013 1609   BILITOT 0.37 09/23/2012 1052

## 2020-08-24 NOTE — Assessment & Plan Note (Signed)
Last B12 level was okay I plan to check B12 level in her next visit

## 2020-08-27 ENCOUNTER — Telehealth: Payer: Self-pay

## 2020-08-27 NOTE — Telephone Encounter (Signed)
She called back. Given below message from Dr. Bertis Ruddy. She verbalized understanding. She has been taking 2,000 units of vitamin D x 5 days a week. She will start today taking 5,000 units of vitamin D daily.

## 2020-08-27 NOTE — Telephone Encounter (Signed)
Called and left below message. Ask her to call the office back. 

## 2020-08-27 NOTE — Telephone Encounter (Signed)
-----   Message from Artis Delay, MD sent at 08/27/2020  9:13 AM EST ----- Regarding: vitamin D Is she taking consistently high dose vitamin D at 5000 units daily? Her result is low If she is taking it, I will have to increase and prescribe vitamin D 50,000 unit weekly If she is not taking it, advise her to take 5000 units daily. It is OTC

## 2020-08-27 NOTE — Telephone Encounter (Signed)
Called and left a message asking her to call the office back. 

## 2020-08-28 ENCOUNTER — Telehealth: Payer: Self-pay | Admitting: Hematology and Oncology

## 2020-08-28 NOTE — Telephone Encounter (Signed)
Scheduled appt per 11/19 sch msg - left message for patient and mailed reminder letter with appt date and time

## 2020-09-07 ENCOUNTER — Ambulatory Visit: Payer: BC Managed Care – PPO

## 2020-09-14 ENCOUNTER — Inpatient Hospital Stay: Payer: BC Managed Care – PPO

## 2020-09-14 ENCOUNTER — Telehealth: Payer: Self-pay | Admitting: Hematology and Oncology

## 2020-09-14 ENCOUNTER — Telehealth: Payer: Self-pay | Admitting: Oncology

## 2020-09-14 NOTE — Telephone Encounter (Signed)
Cancelled 12/10 iron infusion per 12/10 sch msg. Left voicemail with cancellation details and phone number for pt to call back to reschedule.

## 2020-09-21 ENCOUNTER — Inpatient Hospital Stay: Payer: BC Managed Care – PPO | Attending: Hematology and Oncology

## 2020-09-21 ENCOUNTER — Other Ambulatory Visit: Payer: Self-pay | Admitting: Hematology and Oncology

## 2020-09-21 ENCOUNTER — Other Ambulatory Visit: Payer: Self-pay

## 2020-09-21 VITALS — BP 113/73 | HR 92 | Temp 98.6°F | Resp 16

## 2020-09-21 DIAGNOSIS — Z87738 Personal history of other specified (corrected) congenital malformations of digestive system: Secondary | ICD-10-CM | POA: Diagnosis not present

## 2020-09-21 DIAGNOSIS — Z800 Family history of malignant neoplasm of digestive organs: Secondary | ICD-10-CM | POA: Insufficient documentation

## 2020-09-21 DIAGNOSIS — E538 Deficiency of other specified B group vitamins: Secondary | ICD-10-CM | POA: Insufficient documentation

## 2020-09-21 DIAGNOSIS — E559 Vitamin D deficiency, unspecified: Secondary | ICD-10-CM | POA: Diagnosis not present

## 2020-09-21 DIAGNOSIS — Z9884 Bariatric surgery status: Secondary | ICD-10-CM | POA: Insufficient documentation

## 2020-09-21 DIAGNOSIS — D509 Iron deficiency anemia, unspecified: Secondary | ICD-10-CM

## 2020-09-21 DIAGNOSIS — Z8371 Family history of colonic polyps: Secondary | ICD-10-CM | POA: Diagnosis not present

## 2020-09-21 DIAGNOSIS — R42 Dizziness and giddiness: Secondary | ICD-10-CM | POA: Diagnosis not present

## 2020-09-21 DIAGNOSIS — R5383 Other fatigue: Secondary | ICD-10-CM | POA: Diagnosis not present

## 2020-09-21 DIAGNOSIS — D500 Iron deficiency anemia secondary to blood loss (chronic): Secondary | ICD-10-CM | POA: Diagnosis present

## 2020-09-21 MED ORDER — SODIUM CHLORIDE 0.9 % IV SOLN
300.0000 mg | Freq: Once | INTRAVENOUS | Status: AC
  Administered 2020-09-21: 15:00:00 300 mg via INTRAVENOUS
  Filled 2020-09-21: qty 15

## 2020-09-21 MED ORDER — SODIUM CHLORIDE 0.9 % IV SOLN
INTRAVENOUS | Status: DC
  Filled 2020-09-21: qty 250

## 2020-09-21 NOTE — Patient Instructions (Signed)

## 2020-09-27 ENCOUNTER — Telehealth: Payer: Self-pay | Admitting: Hematology and Oncology

## 2020-09-27 NOTE — Telephone Encounter (Signed)
Rescheduled pt's appt per 12/22 sch msg - left message for patient with appt date and time

## 2020-10-05 ENCOUNTER — Other Ambulatory Visit: Payer: Self-pay

## 2020-10-05 ENCOUNTER — Inpatient Hospital Stay: Payer: BC Managed Care – PPO

## 2020-10-05 VITALS — BP 135/81 | HR 80 | Temp 98.2°F | Resp 16

## 2020-10-05 DIAGNOSIS — D509 Iron deficiency anemia, unspecified: Secondary | ICD-10-CM

## 2020-10-05 DIAGNOSIS — D500 Iron deficiency anemia secondary to blood loss (chronic): Secondary | ICD-10-CM | POA: Diagnosis not present

## 2020-10-05 MED ORDER — SODIUM CHLORIDE 0.9 % IV SOLN
Freq: Once | INTRAVENOUS | Status: AC
  Filled 2020-10-05: qty 250

## 2020-10-05 MED ORDER — SODIUM CHLORIDE 0.9 % IV SOLN
300.0000 mg | Freq: Once | INTRAVENOUS | Status: AC
  Administered 2020-10-05: 300 mg via INTRAVENOUS
  Filled 2020-10-05: qty 5

## 2020-10-05 MED ORDER — SODIUM CHLORIDE 0.9% FLUSH
10.0000 mL | Freq: Once | INTRAVENOUS | Status: AC | PRN
  Administered 2020-10-05: 10 mL
  Filled 2020-10-05: qty 10

## 2020-10-05 NOTE — Patient Instructions (Signed)

## 2020-10-10 ENCOUNTER — Ambulatory Visit: Payer: BC Managed Care – PPO

## 2021-02-19 ENCOUNTER — Encounter: Payer: Self-pay | Admitting: Hematology and Oncology

## 2021-02-19 ENCOUNTER — Inpatient Hospital Stay: Payer: BC Managed Care – PPO

## 2021-02-19 ENCOUNTER — Inpatient Hospital Stay: Payer: BC Managed Care – PPO | Attending: Hematology and Oncology | Admitting: Hematology and Oncology

## 2021-03-26 ENCOUNTER — Telehealth: Payer: Self-pay

## 2021-03-26 ENCOUNTER — Other Ambulatory Visit: Payer: Self-pay | Admitting: Hematology and Oncology

## 2021-03-26 ENCOUNTER — Telehealth: Payer: Self-pay | Admitting: Hematology and Oncology

## 2021-03-26 NOTE — Telephone Encounter (Signed)
She called and left a message wanting to reschedule missed appts in May. She feels like her iron is low.

## 2021-03-26 NOTE — Telephone Encounter (Signed)
I sent LOS for scheduler to call and reschedule

## 2021-03-26 NOTE — Telephone Encounter (Signed)
Scheduled appt per 6/21 sch msg. Pt aware.  

## 2021-04-02 ENCOUNTER — Inpatient Hospital Stay: Payer: BC Managed Care – PPO | Attending: Hematology and Oncology

## 2021-04-02 ENCOUNTER — Encounter: Payer: Self-pay | Admitting: Hematology and Oncology

## 2021-04-02 ENCOUNTER — Other Ambulatory Visit: Payer: Self-pay

## 2021-04-02 ENCOUNTER — Inpatient Hospital Stay (HOSPITAL_BASED_OUTPATIENT_CLINIC_OR_DEPARTMENT_OTHER): Payer: BC Managed Care – PPO | Admitting: Hematology and Oncology

## 2021-04-02 DIAGNOSIS — Z8371 Family history of colonic polyps: Secondary | ICD-10-CM | POA: Insufficient documentation

## 2021-04-02 DIAGNOSIS — Z87738 Personal history of other specified (corrected) congenital malformations of digestive system: Secondary | ICD-10-CM | POA: Diagnosis not present

## 2021-04-02 DIAGNOSIS — E559 Vitamin D deficiency, unspecified: Secondary | ICD-10-CM | POA: Insufficient documentation

## 2021-04-02 DIAGNOSIS — D509 Iron deficiency anemia, unspecified: Secondary | ICD-10-CM | POA: Diagnosis not present

## 2021-04-02 DIAGNOSIS — R5383 Other fatigue: Secondary | ICD-10-CM | POA: Insufficient documentation

## 2021-04-02 DIAGNOSIS — Z800 Family history of malignant neoplasm of digestive organs: Secondary | ICD-10-CM | POA: Diagnosis not present

## 2021-04-02 DIAGNOSIS — E538 Deficiency of other specified B group vitamins: Secondary | ICD-10-CM

## 2021-04-02 DIAGNOSIS — Z9884 Bariatric surgery status: Secondary | ICD-10-CM | POA: Diagnosis not present

## 2021-04-02 LAB — CBC WITH DIFFERENTIAL/PLATELET
Abs Immature Granulocytes: 0.01 10*3/uL (ref 0.00–0.07)
Basophils Absolute: 0.1 10*3/uL (ref 0.0–0.1)
Basophils Relative: 1 %
Eosinophils Absolute: 0.3 10*3/uL (ref 0.0–0.5)
Eosinophils Relative: 4 %
HCT: 40 % (ref 36.0–46.0)
Hemoglobin: 13.8 g/dL (ref 12.0–15.0)
Immature Granulocytes: 0 %
Lymphocytes Relative: 35 %
Lymphs Abs: 2.1 10*3/uL (ref 0.7–4.0)
MCH: 32.5 pg (ref 26.0–34.0)
MCHC: 34.5 g/dL (ref 30.0–36.0)
MCV: 94.1 fL (ref 80.0–100.0)
Monocytes Absolute: 0.4 10*3/uL (ref 0.1–1.0)
Monocytes Relative: 6 %
Neutro Abs: 3.4 10*3/uL (ref 1.7–7.7)
Neutrophils Relative %: 54 %
Platelets: 299 10*3/uL (ref 150–400)
RBC: 4.25 MIL/uL (ref 3.87–5.11)
RDW: 12.3 % (ref 11.5–15.5)
WBC: 6.2 10*3/uL (ref 4.0–10.5)
nRBC: 0 % (ref 0.0–0.2)

## 2021-04-02 LAB — IRON AND TIBC
Iron: 172 ug/dL — ABNORMAL HIGH (ref 41–142)
Saturation Ratios: 48 % (ref 21–57)
TIBC: 361 ug/dL (ref 236–444)
UIBC: 189 ug/dL (ref 120–384)

## 2021-04-02 LAB — FERRITIN: Ferritin: 83 ng/mL (ref 11–307)

## 2021-04-02 LAB — VITAMIN B12: Vitamin B-12: 471 pg/mL (ref 180–914)

## 2021-04-02 LAB — VITAMIN D 25 HYDROXY (VIT D DEFICIENCY, FRACTURES): Vit D, 25-Hydroxy: 41.42 ng/mL (ref 30–100)

## 2021-04-02 NOTE — Assessment & Plan Note (Signed)
Vitamin D level is adequate She will continue oral high-dose vitamin D replacement therapy

## 2021-04-02 NOTE — Assessment & Plan Note (Signed)
She will continue oral vitamin B12 supplement for now

## 2021-04-02 NOTE — Progress Notes (Signed)
[facility] OFFICE PROGRESS NOTE  Verlon Au, MD  ASSESSMENT & PLAN:  H/O gastric bypass She will be at risk for multiple mineral deficiencies She will need long-term follow-up.  Vitamin B12 deficiency She will continue oral vitamin B12 supplement for now  Iron deficiency anemia She is not anemic I plan to see her again in 6 months for further follow-up and repeat iron studies  Vitamin D deficiency Vitamin D level is adequate She will continue oral high-dose vitamin D replacement therapy  Orders Placed This Encounter  Procedures   CBC with Differential/Platelet    Standing Status:   Standing    Number of Occurrences:   22    Standing Expiration Date:   04/02/2022   Ferritin    Standing Status:   Future    Standing Expiration Date:   04/02/2022   Iron and TIBC    Standing Status:   Future    Standing Expiration Date:   04/02/2022   Vitamin B12    Standing Status:   Future    Standing Expiration Date:   04/02/2022   VITAMIN D 25 Hydroxy (Vit-D Deficiency, Fractures)    Standing Status:   Future    Standing Expiration Date:   04/02/2022    The total time spent in the appointment was 20 minutes encounter with patients including review of chart and various tests results, discussions about plan of care and coordination of care plan   All questions were answered. The patient knows to call the clinic with any problems, questions or concerns. No barriers to learning was detected.    Artis Delay, MD 6/28/202210:25 AM  INTERVAL HISTORY: Abigail [name] 45 y.o. female returns for further follow-up on multiple mineral deficiencies She complained of fatigue She is taking all her supplement as prescribed She denies menorrhagia; she is on birth control pill  SUMMARY OF HEMATOLOGIC HISTORY: Abigail [name] 45 y.o. female was lost to follow-up I saw her many years ago for similar reasons She is billed as a new patient today I reviewed the patient's records  extensive and collaborated the history with the patient. Summary of her history is as follows: She has with background history of morbid obesity status post gastric bypass in 2003. She was found to have history of B12 and iron deficiency anemia and had received intravenous iron infusion in the past. In November 2014, she received one more dose of intravenous iron infusion She underwent endometrial ablation and with less bleeding Last appointment here was around 2016 and she was lost to follow-up I have reviewed her electronic records extensively Early this year, she had bowel obstruction requiring laparoscopic revision to open bypass surgery Since then, she have lost more weight When I saw her last, she weighed close 290 pounds Today, she weighed only 144 pounds  She had multiple labs drawn early September which confirm iron deficiency with mild anemia.  B12 level was adequate She has severe vitamin D deficiency but is not taking supplement  The patient is pleased with her current weight She felt that she have stopped losing weight and is actually gaining some healthy weight She is taking oral iron twice a day causing severe constipation She is still taking oral B12 supplement The patient denies any recent signs or symptoms of bleeding such as spontaneous epistaxis, hematuria or hematochezia. She has minimum menstrual flow since endometrial ablation  Recently, she started to have excessive craving for ice She broke 2 teeth from chewing on  ice She complained of excessive fatigue with occasional dizziness She received iron sucrose in December 2021  I have reviewed the past medical history, past surgical history, social history and family history with the patient and they are unchanged from previous note.  ALLERGIES:  has No Known Allergies.  MEDICATIONS:  Current Outpatient Medications  Medication Sig Dispense Refill   ALPRAZolam (XANAX) 0.5 MG tablet Take 0.5 mg by mouth as needed.      amphetamine-dextroamphetamine (ADDERALL) 20 MG tablet Take 10 mg by mouth in the morning and at bedtime.     cholecalciferol (VITAMIN D3) 25 MCG (1000 UNIT) tablet Take 5,000 Units by mouth daily.     ibuprofen (ADVIL) 800 MG tablet Take by mouth.     Levonorgestrel-Ethinyl Estradiol (AMETHIA) 0.15-0.03 &0.01 MG tablet Take 1 tablet by mouth daily.     vitamin B-12 (CYANOCOBALAMIN) 1000 MCG tablet Take 1,000 mcg by mouth daily.     No current facility-administered medications for this visit.     REVIEW OF SYSTEMS:   Constitutional: Denies fevers, chills or night sweats Eyes: Denies blurriness of vision Ears, nose, mouth, throat, and face: Denies mucositis or sore throat Respiratory: Denies cough, dyspnea or wheezes Cardiovascular: Denies palpitation, chest discomfort or lower extremity swelling Gastrointestinal:  Denies nausea, heartburn or change in bowel habits Skin: Denies abnormal skin rashes Lymphatics: Denies new lymphadenopathy or easy bruising Neurological:Denies numbness, tingling or new weaknesses Behavioral/Psych: Mood is stable, no new changes  All other systems were reviewed with the patient and are negative.  PHYSICAL EXAMINATION: ECOG PERFORMANCE STATUS: 0 - Asymptomatic  Vitals:   04/02/21 1005  BP: 116/71  Pulse: 64  Resp: 18  Temp: 97.8 F (36.6 C)  SpO2: 100%   Filed Weights   04/02/21 1005  Weight: 151 lb 3.2 oz (68.6 kg)    GENERAL:alert, no distress and comfortable SKIN: skin color, texture, turgor are normal, no rashes or significant lesions EYES: normal, Conjunctiva are pink and non-injected, sclera clear OROPHARYNX:no exudate, no erythema and lips, buccal mucosa, and tongue normal  NECK: supple, thyroid normal size, non-tender, without nodularity LYMPH:  no palpable lymphadenopathy in the cervical, axillary or inguinal LUNGS: clear to auscultation and percussion with normal breathing effort HEART: regular rate & rhythm and no murmurs and no  lower extremity edema ABDOMEN:abdomen soft, non-tender and normal bowel sounds Musculoskeletal:no cyanosis of digits and no clubbing  NEURO: alert & oriented x 3 with fluent speech, no focal motor/sensory deficits  LABORATORY DATA:  I have reviewed the data as listed     Component Value Date/Time   NA 139 09/29/2013 1609   NA 140 09/23/2012 1052   K 3.6 09/29/2013 1609   K 4.5 09/23/2012 1052   CL 101 09/29/2013 1609   CL 106 09/23/2012 1052   CO2 25 09/29/2013 1609   CO2 28 09/23/2012 1052   GLUCOSE 94 09/29/2013 1609   GLUCOSE 95 09/23/2012 1052   BUN 8 09/29/2013 1609   BUN 9.0 09/23/2012 1052   CREATININE 0.74 09/29/2013 1609   CREATININE 0.7 09/23/2012 1052   CALCIUM 9.9 09/29/2013 1609   CALCIUM 9.0 09/23/2012 1052   PROT 7.4 09/29/2013 1609   PROT 6.8 09/23/2012 1052   ALBUMIN 4.9 09/29/2013 1609   ALBUMIN 3.4 (L) 09/23/2012 1052   AST 18 09/29/2013 1609   AST 26 09/23/2012 1052   ALT 23 09/29/2013 1609   ALT 21 09/23/2012 1052   ALKPHOS 81 09/29/2013 1609   ALKPHOS 101 09/23/2012 1052  BILITOT 0.6 09/29/2013 1609   BILITOT 0.37 09/23/2012 1052   GFRNONAA >89 09/29/2013 1609   GFRAA >89 09/29/2013 1609    No results found for: SPEP, UPEP  Lab Results  Component Value Date   WBC 6.2 04/02/2021   NEUTROABS 3.4 04/02/2021   HGB 13.8 04/02/2021   HCT 40.0 04/02/2021   MCV 94.1 04/02/2021   PLT 299 04/02/2021      Chemistry      Component Value Date/Time   NA 139 09/29/2013 1609   NA 140 09/23/2012 1052   K 3.6 09/29/2013 1609   K 4.5 09/23/2012 1052   CL 101 09/29/2013 1609   CL 106 09/23/2012 1052   CO2 25 09/29/2013 1609   CO2 28 09/23/2012 1052   BUN 8 09/29/2013 1609   BUN 9.0 09/23/2012 1052   CREATININE 0.74 09/29/2013 1609   CREATININE 0.7 09/23/2012 1052      Component Value Date/Time   CALCIUM 9.9 09/29/2013 1609   CALCIUM 9.0 09/23/2012 1052   ALKPHOS 81 09/29/2013 1609   ALKPHOS 101 09/23/2012 1052   AST 18 09/29/2013 1609    AST 26 09/23/2012 1052   ALT 23 09/29/2013 1609   ALT 21 09/23/2012 1052   BILITOT 0.6 09/29/2013 1609   BILITOT 0.37 09/23/2012 1052       RADIOGRAPHIC STUDIES: I have personally reviewed the radiological images as listed and agreed with the findings in the report. No results found.

## 2021-04-02 NOTE — Assessment & Plan Note (Signed)
She will be at risk for multiple mineral deficiencies She will need long-term follow-up.

## 2021-04-02 NOTE — Assessment & Plan Note (Signed)
She is not anemic I plan to see her again in 6 months for further follow-up and repeat iron studies

## 2021-04-03 ENCOUNTER — Telehealth: Payer: Self-pay

## 2021-04-03 ENCOUNTER — Encounter: Payer: Self-pay | Admitting: Hematology and Oncology

## 2021-04-03 ENCOUNTER — Telehealth: Payer: Self-pay | Admitting: Hematology and Oncology

## 2021-04-03 NOTE — Telephone Encounter (Signed)
This nurse spoke with patient and informed of results of iron studies, B-12 and Vit D levels.  Also made aware of recommendations per Dr. Bertis Ruddy.  Patient acknoweldged understanding and knows to call the clinci with any questions or concerns.

## 2021-04-03 NOTE — Telephone Encounter (Signed)
Scheduled appt per 6/29 sch msg. Pt aware.  

## 2021-10-11 ENCOUNTER — Encounter: Payer: Self-pay | Admitting: Hematology and Oncology

## 2021-10-11 ENCOUNTER — Inpatient Hospital Stay: Payer: BC Managed Care – PPO | Attending: Hematology and Oncology | Admitting: Hematology and Oncology

## 2021-10-11 ENCOUNTER — Inpatient Hospital Stay: Payer: BC Managed Care – PPO

## 2021-10-11 ENCOUNTER — Other Ambulatory Visit: Payer: Self-pay

## 2021-10-11 DIAGNOSIS — Z79899 Other long term (current) drug therapy: Secondary | ICD-10-CM | POA: Insufficient documentation

## 2021-10-11 DIAGNOSIS — E538 Deficiency of other specified B group vitamins: Secondary | ICD-10-CM | POA: Insufficient documentation

## 2021-10-11 DIAGNOSIS — Z793 Long term (current) use of hormonal contraceptives: Secondary | ICD-10-CM | POA: Insufficient documentation

## 2021-10-11 DIAGNOSIS — D509 Iron deficiency anemia, unspecified: Secondary | ICD-10-CM

## 2021-10-11 DIAGNOSIS — Z9884 Bariatric surgery status: Secondary | ICD-10-CM | POA: Diagnosis not present

## 2021-10-11 DIAGNOSIS — E559 Vitamin D deficiency, unspecified: Secondary | ICD-10-CM | POA: Insufficient documentation

## 2021-10-11 LAB — CBC WITH DIFFERENTIAL/PLATELET
Abs Immature Granulocytes: 0.01 10*3/uL (ref 0.00–0.07)
Basophils Absolute: 0.1 10*3/uL (ref 0.0–0.1)
Basophils Relative: 1 %
Eosinophils Absolute: 0.3 10*3/uL (ref 0.0–0.5)
Eosinophils Relative: 7 %
HCT: 40.5 % (ref 36.0–46.0)
Hemoglobin: 14.1 g/dL (ref 12.0–15.0)
Immature Granulocytes: 0 %
Lymphocytes Relative: 33 %
Lymphs Abs: 1.6 10*3/uL (ref 0.7–4.0)
MCH: 32.1 pg (ref 26.0–34.0)
MCHC: 34.8 g/dL (ref 30.0–36.0)
MCV: 92.3 fL (ref 80.0–100.0)
Monocytes Absolute: 0.3 10*3/uL (ref 0.1–1.0)
Monocytes Relative: 7 %
Neutro Abs: 2.6 10*3/uL (ref 1.7–7.7)
Neutrophils Relative %: 52 %
Platelets: 293 10*3/uL (ref 150–400)
RBC: 4.39 MIL/uL (ref 3.87–5.11)
RDW: 11.9 % (ref 11.5–15.5)
WBC: 4.9 10*3/uL (ref 4.0–10.5)
nRBC: 0 % (ref 0.0–0.2)

## 2021-10-11 LAB — VITAMIN B12: Vitamin B-12: 332 pg/mL (ref 180–914)

## 2021-10-11 LAB — IRON AND IRON BINDING CAPACITY (CC-WL,HP ONLY)
Iron: 196 ug/dL — ABNORMAL HIGH (ref 28–170)
Saturation Ratios: 50 % — ABNORMAL HIGH (ref 10.4–31.8)
TIBC: 389 ug/dL (ref 250–450)
UIBC: 193 ug/dL (ref 148–442)

## 2021-10-11 LAB — FERRITIN: Ferritin: 60 ng/mL (ref 11–307)

## 2021-10-11 LAB — VITAMIN D 25 HYDROXY (VIT D DEFICIENCY, FRACTURES): Vit D, 25-Hydroxy: 23.3 ng/mL — ABNORMAL LOW (ref 30–100)

## 2021-10-11 NOTE — Progress Notes (Signed)
[facility] OFFICE PROGRESS NOTE  Bartholome Bill, MD  ASSESSMENT & PLAN:  Iron deficiency anemia The patient is not anemic or iron deficient I recommend close follow-up with primary care doctor She does not need IV iron treatment  Vitamin D deficiency Vitamin D level is suboptimal She will continue oral high-dose vitamin D replacement therapy  Vitamin B12 deficiency She will continue oral vitamin B12 supplement indefinitely  No orders of the defined types were placed in this encounter.   The total time spent in the appointment was 20 minutes encounter with patients including review of chart and various tests results, discussions about plan of care and coordination of care plan   All questions were answered. The patient knows to call the clinic with any problems, questions or concerns. No barriers to learning was detected.    Heath Lark, MD 1/6/20235:04 PM  INTERVAL HISTORY: Abigail [name] 46 y.o. female returns for further follow-up for multiple mineral deficiencies due to bariatric surgery She is doing well Her energy level is good She admits she has missed several intermittent doses of vitamin D  SUMMARY OF HEMATOLOGIC HISTORY: I reviewed the patient's records extensive and collaborated the history with the patient. Summary of her history is as follows: She has with background history of morbid obesity status post gastric bypass in 2003. She was found to have history of B12 and iron deficiency anemia and had received intravenous iron infusion in the past. In November 2014, she received one more dose of intravenous iron infusion She underwent endometrial ablation and with less bleeding Last appointment here was around 2016 and she was lost to follow-up I have reviewed her electronic records extensively Early this year, she had bowel obstruction requiring laparoscopic revision to open bypass surgery Since then, she have lost more weight When I saw  her last, she weighed close 290 pounds Today, she weighed only 144 pounds  She had multiple labs drawn early September which confirm iron deficiency with mild anemia.  B12 level was adequate She has severe vitamin D deficiency but is not taking supplement  The patient is pleased with her current weight She felt that she have stopped losing weight and is actually gaining some healthy weight She is taking oral iron twice a day causing severe constipation She is still taking oral B12 supplement The patient denies any recent signs or symptoms of bleeding such as spontaneous epistaxis, hematuria or hematochezia. She has minimum menstrual flow since endometrial ablation  Recently, she started to have excessive craving for ice She broke 2 teeth from chewing on ice She complained of excessive fatigue with occasional dizziness She received iron sucrose in December 2021   I have reviewed the past medical history, past surgical history, social history and family history with the patient and they are unchanged from previous note.  ALLERGIES:  has No Known Allergies.  MEDICATIONS:  Current Outpatient Medications  Medication Sig Dispense Refill   ALPRAZolam (XANAX) 0.5 MG tablet Take 0.5 mg by mouth as needed.     amphetamine-dextroamphetamine (ADDERALL) 20 MG tablet Take 10 mg by mouth in the morning and at bedtime.     cholecalciferol (VITAMIN D3) 25 MCG (1000 UNIT) tablet Take 5,000 Units by mouth daily.     Levonorgestrel-Ethinyl Estradiol (AMETHIA) 0.15-0.03 &0.01 MG tablet Take 1 tablet by mouth daily.     vitamin B-12 (CYANOCOBALAMIN) 1000 MCG tablet Take 1,000 mcg by mouth daily.     No current facility-administered medications for  this visit.     REVIEW OF SYSTEMS:   Constitutional: Denies fevers, chills or night sweats Eyes: Denies blurriness of vision Ears, nose, mouth, throat, and face: Denies mucositis or sore throat Respiratory: Denies cough, dyspnea or wheezes Cardiovascular:  Denies palpitation, chest discomfort or lower extremity swelling Gastrointestinal:  Denies nausea, heartburn or change in bowel habits Skin: Denies abnormal skin rashes Lymphatics: Denies new lymphadenopathy or easy bruising Neurological:Denies numbness, tingling or new weaknesses Behavioral/Psych: Mood is stable, no new changes  All other systems were reviewed with the patient and are negative.  PHYSICAL EXAMINATION: ECOG PERFORMANCE STATUS: 0 - Asymptomatic  Vitals:   10/11/21 1047  BP: 126/85  Pulse: (!) 59  Resp: 18  Temp: 98.5 F (36.9 C)  SpO2: 100%   Filed Weights   10/11/21 1047  Weight: 150 lb 6.4 oz (68.2 kg)    GENERAL:alert, no distress and comfortable NEURO: alert & oriented x 3 with fluent speech, no focal motor/sensory deficits  LABORATORY DATA:  I have reviewed the data as listed     Component Value Date/Time   NA 139 09/29/2013 1609   NA 140 09/23/2012 1052   K 3.6 09/29/2013 1609   K 4.5 09/23/2012 1052   CL 101 09/29/2013 1609   CL 106 09/23/2012 1052   CO2 25 09/29/2013 1609   CO2 28 09/23/2012 1052   GLUCOSE 94 09/29/2013 1609   GLUCOSE 95 09/23/2012 1052   BUN 8 09/29/2013 1609   BUN 9.0 09/23/2012 1052   CREATININE 0.74 09/29/2013 1609   CREATININE 0.7 09/23/2012 1052   CALCIUM 9.9 09/29/2013 1609   CALCIUM 9.0 09/23/2012 1052   PROT 7.4 09/29/2013 1609   PROT 6.8 09/23/2012 1052   ALBUMIN 4.9 09/29/2013 1609   ALBUMIN 3.4 (L) 09/23/2012 1052   AST 18 09/29/2013 1609   AST 26 09/23/2012 1052   ALT 23 09/29/2013 1609   ALT 21 09/23/2012 1052   ALKPHOS 81 09/29/2013 1609   ALKPHOS 101 09/23/2012 1052   BILITOT 0.6 09/29/2013 1609   BILITOT 0.37 09/23/2012 1052   GFRNONAA >89 09/29/2013 1609   GFRAA >89 09/29/2013 1609    No results found for: SPEP, UPEP  Lab Results  Component Value Date   WBC 4.9 10/11/2021   NEUTROABS 2.6 10/11/2021   HGB 14.1 10/11/2021   HCT 40.5 10/11/2021   MCV 92.3 10/11/2021   PLT 293 10/11/2021       Chemistry      Component Value Date/Time   NA 139 09/29/2013 1609   NA 140 09/23/2012 1052   K 3.6 09/29/2013 1609   K 4.5 09/23/2012 1052   CL 101 09/29/2013 1609   CL 106 09/23/2012 1052   CO2 25 09/29/2013 1609   CO2 28 09/23/2012 1052   BUN 8 09/29/2013 1609   BUN 9.0 09/23/2012 1052   CREATININE 0.74 09/29/2013 1609   CREATININE 0.7 09/23/2012 1052      Component Value Date/Time   CALCIUM 9.9 09/29/2013 1609   CALCIUM 9.0 09/23/2012 1052   ALKPHOS 81 09/29/2013 1609   ALKPHOS 101 09/23/2012 1052   AST 18 09/29/2013 1609   AST 26 09/23/2012 1052   ALT 23 09/29/2013 1609   ALT 21 09/23/2012 1052   BILITOT 0.6 09/29/2013 1609   BILITOT 0.37 09/23/2012 1052

## 2021-10-11 NOTE — Assessment & Plan Note (Signed)
She will continue oral vitamin B12 supplement indefinitely

## 2021-10-11 NOTE — Assessment & Plan Note (Signed)
Vitamin D level is suboptimal She will continue oral high-dose vitamin D replacement therapy

## 2021-10-11 NOTE — Assessment & Plan Note (Signed)
The patient is not anemic or iron deficient I recommend close follow-up with primary care doctor She does not need IV iron treatment

## 2021-10-15 ENCOUNTER — Telehealth: Payer: Self-pay

## 2021-10-15 NOTE — Telephone Encounter (Signed)
-----   Message from Artis Delay, MD sent at 10/11/2021  2:40 PM EST ----- Pls call her Iron and B12 are ok Vitamin D is still borderline low, I recommend continue high dose vitamin D 5000 units for now and recheck with PCP in a few months

## 2021-10-15 NOTE — Telephone Encounter (Signed)
Called and given below message. She verbalized understanding.
